# Patient Record
Sex: Female | Born: 1990
Health system: Southern US, Community
[De-identification: ages and names within clinical notes are randomized; demographics above are authoritative.]

## PROBLEM LIST (undated history)

## (undated) DIAGNOSIS — J302 Other seasonal allergic rhinitis: Secondary | ICD-10-CM

## (undated) DIAGNOSIS — F988 Other specified behavioral and emotional disorders with onset usually occurring in childhood and adolescence: Secondary | ICD-10-CM

## (undated) DIAGNOSIS — L709 Acne, unspecified: Secondary | ICD-10-CM

## (undated) HISTORY — DX: Other specified behavioral and emotional disorders with onset usually occurring in childhood and adolescence: F98.8

## (undated) HISTORY — PX: MOUTH SURGERY: SHX715

## (undated) HISTORY — DX: Acne, unspecified: L70.9

## (undated) HISTORY — DX: Other seasonal allergic rhinitis: J30.2

## (undated) HISTORY — PX: ADENOIDECTOMY: SHX5191

## (undated) HISTORY — PX: TYMPANOSTOMY TUBE PLACEMENT: SHX32

---
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
## 2010-06-28 ENCOUNTER — Ambulatory Visit: Payer: Self-pay | Admitting: Obstetrics and Gynecology

## 2011-07-19 ENCOUNTER — Other Ambulatory Visit: Payer: Self-pay | Admitting: *Deleted

## 2011-07-19 MED ORDER — DESOGESTREL-ETHINYL ESTRADIOL 0.15-30 MG-MCG PO TABS: 1.0000 | ORAL_TABLET | Freq: Every day | ORAL | Status: DC

## 2011-09-12 ENCOUNTER — Encounter: Payer: Self-pay | Admitting: Obstetrics and Gynecology

## 2011-09-16 ENCOUNTER — Ambulatory Visit (INDEPENDENT_AMBULATORY_CARE_PROVIDER_SITE_OTHER): Payer: BC Managed Care – PPO | Admitting: Physician Assistant

## 2011-09-16 VITALS — BP 105/69 | HR 114 | Temp 98.2°F | Resp 16 | Ht 60.5 in | Wt 108.0 lb

## 2011-09-16 DIAGNOSIS — J069 Acute upper respiratory infection, unspecified: Secondary | ICD-10-CM

## 2011-09-16 MED ORDER — AMOXICILLIN 875 MG PO TABS: 1750.0000 mg | ORAL_TABLET | Freq: Two times a day (BID) | ORAL | Status: AC

## 2011-09-16 MED ORDER — GUAIFENESIN ER 1200 MG PO TB12: 1.0000 | ORAL_TABLET | Freq: Two times a day (BID) | ORAL | Status: DC | PRN

## 2011-09-16 MED ORDER — IPRATROPIUM BROMIDE 0.03 % NA SOLN: 2.0000 | Freq: Two times a day (BID) | NASAL | Status: DC

## 2011-09-16 MED ORDER — AMOXICILLIN 875 MG PO TABS: 1750.0000 mg | ORAL_TABLET | Freq: Two times a day (BID) | ORAL | Status: DC

## 2011-09-16 NOTE — Patient Instructions (Signed)
Rest.  Drink at least 64 ounces of liquid daily.

## 2011-09-16 NOTE — Progress Notes (Signed)
  Subjective:    Patient ID: Ariel Jones, female    DOB: 11-02-1990, 21 y.o.   MRN: [id number]  HPI  Nasal congestion; pressure in cheeks and forehead.  Worst first thing in the mornings.  Pseudofed helps, but doesn't last long. Nausea this am, no vomiting.  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  Home now for Spring Break.  Review of Systems As above. No fever or chills. No cough. No sore throat. No ear pain. No headache. No diarrhea or constipation. No myalgias, arthralgias or rash.    Objective:   Physical Exam  Vitals reviewed. Constitutional: She is oriented to person, place, and time. Vital signs are normal. She appears well-developed and well-nourished. No distress.  HENT:  Head: Normocephalic and atraumatic.  Right Ear: Hearing, tympanic membrane, external ear and ear canal normal.  Left Ear: Hearing, tympanic membrane, external ear and ear canal normal.  Nose: Mucosal edema and rhinorrhea present.  No foreign bodies. Right sinus exhibits no maxillary sinus tenderness and no frontal sinus tenderness. Left sinus exhibits no maxillary sinus tenderness and no frontal sinus tenderness.  Mouth/Throat: Uvula is midline, oropharynx is clear and moist and mucous membranes are normal. No uvula swelling. No oropharyngeal exudate.  Eyes: Conjunctivae and EOM are normal. Pupils are equal, round, and reactive to light. Right eye exhibits no discharge. Left eye exhibits no discharge. No scleral icterus.  Neck: Trachea normal, normal range of motion and full passive range of motion without pain. Neck supple. No mass and no thyromegaly present.  Cardiovascular: Normal rate, regular rhythm and normal heart sounds.   Pulmonary/Chest: Effort normal and breath sounds normal.  Lymphadenopathy:       Head (right side): No submandibular, no tonsillar, no preauricular, no posterior auricular and no occipital adenopathy present.       Head (left side): No submandibular, no tonsillar, no  preauricular and no occipital adenopathy present.    She has no cervical adenopathy.       Right: No supraclavicular adenopathy present.       Left: No supraclavicular adenopathy present.  Neurological: She is alert and oriented to person, place, and time. She has normal strength. No cranial nerve deficit or sensory deficit.  Skin: Skin is warm, dry and intact. No rash noted.  Psychiatric: She has a normal mood and affect. Her speech is normal and behavior is normal.      Assessment & Plan:  Viral URI versus early sinusitis. Supportive measures.  Anticipatory guidance provided. OTC Mucinex maximum strength twice a day, Atrovent nasal spray twice a day. If no improvement in her symptoms over the next 3 days advise that she fill amoxicillin 875 mg, 2 by mouth twice a day x5 days, #20 no refills.

## 2011-09-18 ENCOUNTER — Ambulatory Visit (INDEPENDENT_AMBULATORY_CARE_PROVIDER_SITE_OTHER): Payer: BC Managed Care – PPO | Admitting: Obstetrics and Gynecology

## 2011-09-18 ENCOUNTER — Encounter: Payer: Self-pay | Admitting: Obstetrics and Gynecology

## 2011-09-18 VITALS — BP 110/60 | Ht 60.0 in | Wt 106.0 lb

## 2011-09-18 DIAGNOSIS — L709 Acne, unspecified: Secondary | ICD-10-CM | POA: Insufficient documentation

## 2011-09-18 DIAGNOSIS — F988 Other specified behavioral and emotional disorders with onset usually occurring in childhood and adolescence: Secondary | ICD-10-CM | POA: Insufficient documentation

## 2011-09-18 DIAGNOSIS — J302 Other seasonal allergic rhinitis: Secondary | ICD-10-CM | POA: Insufficient documentation

## 2011-09-18 DIAGNOSIS — Z01419 Encounter for gynecological examination (general) (routine) without abnormal findings: Secondary | ICD-10-CM

## 2011-09-18 MED ORDER — DESOGESTREL-ETHINYL ESTRADIOL 0.15-30 MG-MCG PO TABS: 1.0000 | ORAL_TABLET | Freq: Every day | ORAL | Status: DC

## 2011-09-18 NOTE — Progress Notes (Signed)
Patient came to see me today for her annual GYN exam. She remains on birth control pills for dysmenorrhea, regularity, and acne. She is very happy on them. She has never been sexually active. She does lab through PCP. She is having no pelvic pain. 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  Physical examination: Ariel Jones present HEENT within normal limits. Neck: Thyroid not large. No masses. Supraclavicular nodes: not enlarged. Breasts: Examined in both sitting midline position. No skin changes and no masses. Abdomen: Soft no guarding rebound or masses or hernia. Pelvic: External: Within normal limits. BUS: Within normal limits. Vaginal:within normal limits. Good estrogen effect. No evidence of cystocele rectocele or enterocele. Cervix: clean. Uterus: Normal size and shape. Adnexa: No masses. Rectovaginal exam: Confirmatory and negative. Extremities: Within normal limits.  Assessment: Normal GYN exam  Plan: Continue oral contraceptives

## 2011-09-19 LAB — URINALYSIS W MICROSCOPIC + REFLEX CULTURE
Casts: NONE SEEN
Glucose, UA: NEGATIVE mg/dL
Hgb urine dipstick: NEGATIVE
Nitrite: NEGATIVE
Specific Gravity, Urine: 1.009 (ref 1.005–1.030)
pH: 7.5 (ref 5.0–8.0)

## 2011-09-20 LAB — URINE CULTURE: Colony Count: NO GROWTH

## 2011-12-06 ENCOUNTER — Telehealth: Payer: Self-pay

## 2011-12-06 NOTE — Telephone Encounter (Signed)
Pt is requesting vyvance  90 day supply written 50 mg

## 2011-12-07 MED ORDER — LISDEXAMFETAMINE DIMESYLATE 50 MG PO CAPS: 50.0000 mg | ORAL_CAPSULE | ORAL | Status: DC

## 2011-12-07 NOTE — Telephone Encounter (Signed)
LMOM RX ready pick up. 

## 2011-12-07 NOTE — Telephone Encounter (Signed)
90 day Rx of vyvanse ready to p/up or mail to patient

## 2012-01-21 ENCOUNTER — Telehealth: Payer: Self-pay | Admitting: Family Medicine

## 2012-01-21 NOTE — Telephone Encounter (Signed)
Patient came in to drop off copy of Vyvanse rx from May 25th-- she tried to mail it into [facility], but they will not take a 90 day rx from a PA--must be from an MD.  Dr. Merla Riches, it appears you saw this patient last August for this.  Can we rx another 90 day for patient to pick up?  Chart in your box.  Please contact patient at [phone].

## 2012-01-22 NOTE — Telephone Encounter (Signed)
The chart indicates that she takes both a 50 mg and 30 mg tablet together in the morning to make up 80 mg of vyvanse//the prescription attached to her chart is just for the 50 mg size Does she need a prescription for both 50 and 30?

## 2012-01-23 NOTE — Telephone Encounter (Signed)
LMOM on cell to CB. H # vm was full.

## 2012-01-23 NOTE — Telephone Encounter (Signed)
Pt only needs 50mg  because she doesn't take as much of the 30's.

## 2012-01-24 MED ORDER — LISDEXAMFETAMINE DIMESYLATE 50 MG PO CAPS: 50.0000 mg | ORAL_CAPSULE | ORAL | Status: DC

## 2012-01-24 NOTE — Telephone Encounter (Signed)
done

## 2012-01-25 NOTE — Telephone Encounter (Signed)
Called pt, LMOM Rx ready to pick up

## 2012-04-14 ENCOUNTER — Telehealth: Payer: Self-pay

## 2012-04-14 NOTE — Telephone Encounter (Signed)
I have called her back to advise. She plans to come in soon.

## 2012-04-14 NOTE — Telephone Encounter (Signed)
It has been greater than 1 year since she was seen, she needs follow up visit. I have advised patient we will not be able to provide her a 90 day Rx as we have in the past, but we may be able to get her a supply to last until she can come in, she states she is in school and can come in the next 2-3 weeks.

## 2012-04-14 NOTE — Telephone Encounter (Signed)
Not seen in 12 months.   She'll need to be seen before we can refill this medication.

## 2012-04-14 NOTE — Telephone Encounter (Signed)
Pt is needing a rx refill on her vivance 90day 50mg  vivance 90day 30mg  please call pt when ready for pick up [phone]

## 2012-04-27 ENCOUNTER — Ambulatory Visit (INDEPENDENT_AMBULATORY_CARE_PROVIDER_SITE_OTHER): Payer: BC Managed Care – PPO | Admitting: Family Medicine

## 2012-04-27 VITALS — BP 132/84 | HR 110 | Resp 18 | Ht 60.0 in | Wt 107.0 lb

## 2012-04-27 DIAGNOSIS — Z0000 Encounter for general adult medical examination without abnormal findings: Secondary | ICD-10-CM

## 2012-04-27 DIAGNOSIS — F988 Other specified behavioral and emotional disorders with onset usually occurring in childhood and adolescence: Secondary | ICD-10-CM

## 2012-04-27 MED ORDER — INFLUENZA VIRUS VACC SPLIT PF IM SUSP: 0.5000 mL | INTRAMUSCULAR | Status: DC

## 2012-04-27 NOTE — Progress Notes (Signed)
21 yo with 15 years of ADD treatment, 4 years of Vyvanse.  She is a Public relations account executive, Psychologist, occupational major, at [employer].  She is on fall break.  ROS: No significant palpitations but occasionally feels that heart is racing No significant weight loss. No insomnia  Patient's most recent evaluation was 4 years ago with a Dr. Jennette Banker.  Objective:  NAD Spent 25 minutes updating hx and discussing classwork Heart:  Reg, no murmur or gallop  Assessment:  Stable on meds  Plan:  Refill Vyvanse rx x 4 months.

## 2012-08-25 ENCOUNTER — Telehealth: Payer: Self-pay

## 2012-08-25 NOTE — Telephone Encounter (Signed)
PT STATES SHE NEED 2 PRESCRIPTIONS WRITTEN. ONE IS FOR VYVANSE 30MG S WITH A 90 DAY SUPPLY AND THE OTHER IS VYVANSE 15MG S WITH A 90 DAY SUPPLY AND IT HAVE TO BE WRITTEN ON A SEPARATE SHEET. ALSO STATED THAT ANY DR CAN WRITE IT. PLEASE CALL [phone]

## 2012-08-25 NOTE — Telephone Encounter (Signed)
Please advise. There is note patient was given Vyvanse, but no record of dosage, patient asking for 90 day supply, can we provide 30 day supply until Dr Milus Glazier in office

## 2012-08-25 NOTE — Telephone Encounter (Signed)
We cannot send in a 90 day supply. DEA database indicates patient got Vyvanse 50 mg #90, 90 days supply and Vyvanse 30 mg #90, 90 days supply both on 05/13/12. However, I do not see either of these prescriptions in epic. I do not feel comfortable writing for this medication. Please send to Dr. Milus Glazier to review.

## 2012-08-26 MED ORDER — LISDEXAMFETAMINE DIMESYLATE 30 MG PO CAPS: ORAL_CAPSULE | ORAL | Status: DC

## 2012-08-26 MED ORDER — LISDEXAMFETAMINE DIMESYLATE 50 MG PO CAPS: 50.0000 mg | ORAL_CAPSULE | ORAL | Status: DC

## 2012-08-26 NOTE — Telephone Encounter (Signed)
Dr Merla Riches has provided her one month of medications until she can return to clinic to see him. She has been worked in for an appt with Dr Merla Riches next month. I have called her mother to advise rx ready for pick up.

## 2012-08-26 NOTE — Telephone Encounter (Signed)
I have spoken to patient and she indicates she has been on this for years. I have spoken to Dr Merla Riches. He will try to help. I need to clarify her meds. I need her to call me back. I left message for her to call me .

## 2012-08-26 NOTE — Telephone Encounter (Signed)
Thank you. I have advised patient. Patient is in college, she is tearful. I have apologized to her for the inconvenience related to this. To you FYI

## 2012-09-21 ENCOUNTER — Encounter: Payer: Self-pay | Admitting: Gynecology

## 2012-09-21 ENCOUNTER — Other Ambulatory Visit (HOSPITAL_COMMUNITY)
Admission: RE | Admit: 2012-09-21 | Discharge: 2012-09-21 | Disposition: A | Discharge status: Home / Self Care | Payer: BC Managed Care – PPO | Source: Ambulatory Visit | Attending: Gynecology | Admitting: Gynecology

## 2012-09-21 ENCOUNTER — Ambulatory Visit (INDEPENDENT_AMBULATORY_CARE_PROVIDER_SITE_OTHER): Payer: BC Managed Care – PPO | Admitting: Gynecology

## 2012-09-21 VITALS — BP 116/70 | Ht 61.0 in | Wt 108.0 lb

## 2012-09-21 DIAGNOSIS — Z01419 Encounter for gynecological examination (general) (routine) without abnormal findings: Secondary | ICD-10-CM

## 2012-09-21 LAB — CBC WITH DIFFERENTIAL/PLATELET
Basophils Relative: 1 % (ref 0–1)
Eosinophils Absolute: 0 10*3/uL (ref 0.0–0.7)
Eosinophils Relative: 1 % (ref 0–5)
Lymphs Abs: 2.4 10*3/uL (ref 0.7–4.0)
MCH: 29.5 pg (ref 26.0–34.0)
MCHC: 34 g/dL (ref 30.0–36.0)
MCV: 86.8 fL (ref 78.0–100.0)
Neutrophils Relative %: 36 % — ABNORMAL LOW (ref 43–77)
Platelets: 255 10*3/uL (ref 150–400)
RBC: 4.1 MIL/uL (ref 3.87–5.11)

## 2012-09-21 LAB — GLUCOSE, RANDOM: Glucose, Bld: 85 mg/dL (ref 70–99)

## 2012-09-21 MED ORDER — DESOGESTREL-ETHINYL ESTRADIOL 0.15-30 MG-MCG PO TABS: 1.0000 | ORAL_TABLET | Freq: Every day | ORAL | Status: DC

## 2012-09-21 NOTE — Patient Instructions (Signed)
Continue on birth control pills as we discussed. Call me for any issues. Followup in one year for annual exam.

## 2012-09-21 NOTE — Addendum Note (Signed)
Addended by: Dayna Barker on: 09/21/2012 12:24 PM   Modules accepted: Orders

## 2012-09-21 NOTE — Progress Notes (Signed)
Ariel Jones 19-Nov-1990 [id number]        22 y.o.  G0P0 for annual exam.  Doing well without complaints.  Past medical history,surgical history, medications, allergies, family history and social history were all reviewed and documented in the EPIC chart. ROS:  Was performed and pertinent positives and negatives are included in the history.  Exam: Kim assistant Filed Vitals:   09/21/12 1151  BP: 116/70  Height: 5\' 1"  (1.549 m)  Weight: 108 lb (48.988 kg)   General appearance  Normal Skin grossly normal Head/Neck normal with no cervical or supraclavicular adenopathy thyroid normal Lungs  clear Cardiac RR, without RMG Abdominal  soft, nontender, without masses, organomegaly or hernia Breasts  examined lying and sitting without masses, retractions, discharge or axillary adenopathy. Pelvic  Ext/BUS/vagina  normal   Cervix  normal with Pap done  Uterus  anteverted, normal size, shape and contour, midline and mobile nontender   Adnexa  Without masses or tenderness    Anus and perineum  normal    without palpated masses or tenderness.    Assessment/Plan:  22 y.o. G0P0 female for annual exam.   1. BCPs. Patient's on  desogestrel containing BCPs for acne suppression. Virginal historically. Reviewed possible increased risk of thrombosis associated with desogestrel containing pills.  Patient understands and accepts and I refilled her times a year. Had some breakthrough bleeding this past month but otherwise regular menses. Will keep menstrual calendar and as long as results of them will follow. If recurrent breakthrough bleeding will call me to consider changing pills.  I prescribed pills at both her school pharmacy and home pharmacy. 2. STD screening. Patient virginal and declines. 3. Pap smear. First Pap smear done today. Plan 3 year repeat if normal. 4. Breast health. SBE monthly reviewed. 5. Gardasil series received. 6. Health maintenance. Baseline CBC urinalysis and glucose ordered.  Plan lipid profile further into the 20s. Followup one year, sooner as needed.    Dara Lords MD, 12:18 PM 09/21/2012

## 2012-09-22 LAB — URINALYSIS W MICROSCOPIC + REFLEX CULTURE
Casts: NONE SEEN
Glucose, UA: NEGATIVE mg/dL
Hgb urine dipstick: NEGATIVE
Ketones, ur: NEGATIVE mg/dL
Protein, ur: NEGATIVE mg/dL
pH: 6 (ref 5.0–8.0)

## 2012-09-23 ENCOUNTER — Ambulatory Visit (INDEPENDENT_AMBULATORY_CARE_PROVIDER_SITE_OTHER): Payer: BC Managed Care – PPO | Admitting: Internal Medicine

## 2012-09-23 VITALS — BP 104/70 | HR 93 | Temp 98.3°F | Resp 16 | Ht 61.0 in | Wt 108.0 lb

## 2012-09-23 DIAGNOSIS — F988 Other specified behavioral and emotional disorders with onset usually occurring in childhood and adolescence: Secondary | ICD-10-CM

## 2012-09-23 MED ORDER — LISDEXAMFETAMINE DIMESYLATE 50 MG PO CAPS: 50.0000 mg | ORAL_CAPSULE | ORAL | Status: DC

## 2012-09-23 MED ORDER — LISDEXAMFETAMINE DIMESYLATE 30 MG PO CAPS: ORAL_CAPSULE | ORAL | Status: DC

## 2012-09-23 NOTE — Progress Notes (Signed)
  Subjective:    Patient ID: Angelica Ran, female    DOB: 09/14/1990, 22 y.o.   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 lisdexamfetamine (VYVANSE) 30 MG capsule    Sig: One daily in the afternoon    Dispense:  90 capsule    Refill:  0    90 day  . lisdexamfetamine (VYVANSE) 50 MG capsule    Sig: Take 1 capsule (50 mg total) by mouth every morning.    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

## 2012-09-25 ENCOUNTER — Encounter: Payer: Self-pay | Admitting: Obstetrics and Gynecology

## 2012-10-06 ENCOUNTER — Other Ambulatory Visit: Payer: Self-pay

## 2012-10-06 MED ORDER — DESOGESTREL-ETHINYL ESTRADIOL 0.15-30 MG-MCG PO TABS: 1.0000 | ORAL_TABLET | Freq: Every day | ORAL | Status: DC

## 2012-11-30 ENCOUNTER — Telehealth: Payer: Self-pay

## 2012-11-30 NOTE — Telephone Encounter (Signed)
Pt is calling to verify when last tetanus was done in this office

## 2012-12-01 NOTE — Telephone Encounter (Signed)
July 02, 2010 was her last TDAP, called pt to let her know.

## 2012-12-15 ENCOUNTER — Encounter: Payer: Self-pay | Admitting: Internal Medicine

## 2013-01-11 ENCOUNTER — Telehealth: Payer: Self-pay

## 2013-01-11 DIAGNOSIS — F988 Other specified behavioral and emotional disorders with onset usually occurring in childhood and adolescence: Secondary | ICD-10-CM

## 2013-01-11 NOTE — Telephone Encounter (Signed)
Pt is calling to see if she could get 2 rx refills  1. Is Vyvanse 50 mg 2. Is Vyvanse 30 mg She is also wanting to get a code to get into her My Chart Call back number is [phone]

## 2013-01-12 MED ORDER — LISDEXAMFETAMINE DIMESYLATE 50 MG PO CAPS: 50.0000 mg | ORAL_CAPSULE | ORAL | Status: DC

## 2013-01-12 MED ORDER — LISDEXAMFETAMINE DIMESYLATE 30 MG PO CAPS: ORAL_CAPSULE | ORAL | Status: DC

## 2013-01-12 NOTE — Telephone Encounter (Signed)
Meds ordered this encounter  Medications  . lisdexamfetamine (VYVANSE) 50 MG capsule    Sig: Take 1 capsule (50 mg total) by mouth every morning.    Dispense:  90 capsule    Refill:  0  . lisdexamfetamine (VYVANSE) 30 MG capsule    Sig: One daily in the afternoon    Dispense:  90 capsule    Refill:  0    90 day

## 2013-01-12 NOTE — Telephone Encounter (Signed)
Patient advised Rx at desk along with the activation code

## 2013-01-12 NOTE — Telephone Encounter (Signed)
Pended please advise printed mychart code for her. Will place in envelope with the Rx's Amy

## 2013-03-24 ENCOUNTER — Ambulatory Visit (INDEPENDENT_AMBULATORY_CARE_PROVIDER_SITE_OTHER): Payer: BC Managed Care – PPO | Admitting: Internal Medicine

## 2013-03-24 VITALS — BP 101/63 | HR 86 | Temp 98.1°F | Resp 16 | Ht 61.5 in | Wt 106.0 lb

## 2013-03-24 DIAGNOSIS — F988 Other specified behavioral and emotional disorders with onset usually occurring in childhood and adolescence: Secondary | ICD-10-CM

## 2013-03-24 MED ORDER — LISDEXAMFETAMINE DIMESYLATE 50 MG PO CAPS: 50.0000 mg | ORAL_CAPSULE | ORAL | Status: DC

## 2013-03-24 MED ORDER — LISDEXAMFETAMINE DIMESYLATE 30 MG PO CAPS: ORAL_CAPSULE | ORAL | Status: DC

## 2013-03-24 NOTE — Progress Notes (Signed)
  Subjective:    Patient ID: Ariel Jones, female    DOB: 01-21-1991, 22 y.o.   MRN: [id number]  HPIf/u ADD Dropped out of furman --unsure what to do  Currently working at [employer] (with sister Irving Burton), working at front [employer], looking into doing scribe work. Taking a break from school. Needs to take a few classes. Living at home, "not too difficult." Still needs Vyvanse to work. "It is hard," if she doesn't take it. Can't describe. Doesn't function well without it.  Health good.  Review of Systems neg    Objective:   Physical Exam  Nursing note and vitals reviewed. Constitutional: She is oriented to person, place, and time. She appears well-developed and well-nourished. No distress.  HENT:  Head: Normocephalic and atraumatic.  Eyes: Pupils are equal, round, and reactive to light.  Neck: Normal range of motion.  Pulmonary/Chest: Effort normal. No respiratory distress.  Musculoskeletal: Normal range of motion.  Neurological: She is alert and oriented to person, place, and time.  Skin: Skin is warm and dry.  Psychiatric: She has a normal mood and affect. Her behavior is normal.   BP 101/63  Pulse 86  Temp(Src) 98.1 F (36.7 C)  Resp 16  Ht 5' 1.5" (1.562 m)  Wt 106 lb (48.081 kg)  BMI 19.71 kg/m2  LMP 03/10/2013        Assessment & Plan:  ADD  Meds ordered this encounter  Medications  . lisdexamfetamine (VYVANSE) 30 MG capsule    Sig: One daily in the afternoon    Dispense:  90 capsule    Refill:  0    90 day  . lisdexamfetamine (VYVANSE) 50 MG capsule    Sig: Take 1 capsule (50 mg total) by mouth every morning.    Dispense:  90 capsule    Refill:  0  . DISCONTD: lisdexamfetamine (VYVANSE) 30 MG capsule    Sig: For 10/10/14One daily in the afternoon    Dispense:  30 capsule    Refill:  0  . DISCONTD: lisdexamfetamine (VYVANSE) 30 MG capsule    Sig: One daily in the afternoon. For 05/24/13    Dispense:  30 capsule    Refill:  0  .  DISCONTD: lisdexamfetamine (VYVANSE) 50 MG capsule    Sig: Take 1 capsule (50 mg total) by mouth every morning. For 04/23/13    Dispense:  90 capsule    Refill:  0  . DISCONTD: lisdexamfetamine (VYVANSE) 50 MG capsule    Sig: Take 1 capsule (50 mg total) by mouth every morning. For 05/24/13    Dispense:  30 capsule    Refill:  0   Remember 90d supply mail order in future F/u 6 mo

## 2013-04-22 ENCOUNTER — Ambulatory Visit (INDEPENDENT_AMBULATORY_CARE_PROVIDER_SITE_OTHER): Payer: BC Managed Care – PPO | Admitting: *Deleted

## 2013-04-22 DIAGNOSIS — Z23 Encounter for immunization: Secondary | ICD-10-CM

## 2013-05-14 ENCOUNTER — Other Ambulatory Visit: Payer: Self-pay | Admitting: Dermatology

## 2013-05-24 ENCOUNTER — Encounter: Payer: Self-pay | Admitting: Internal Medicine

## 2013-05-26 ENCOUNTER — Other Ambulatory Visit: Payer: Self-pay | Admitting: Dermatology

## 2013-07-25 ENCOUNTER — Telehealth: Payer: Self-pay

## 2013-07-25 DIAGNOSIS — F988 Other specified behavioral and emotional disorders with onset usually occurring in childhood and adolescence: Secondary | ICD-10-CM

## 2013-07-25 NOTE — Telephone Encounter (Signed)
Pt called to request a written prescription Vyvanse 50 mg, also another for 30 mg, 90 day prescription

## 2013-07-27 MED ORDER — LISDEXAMFETAMINE DIMESYLATE 30 MG PO CAPS: ORAL_CAPSULE | ORAL | Status: DC

## 2013-07-27 MED ORDER — LISDEXAMFETAMINE DIMESYLATE 50 MG PO CAPS
50.0000 mg | ORAL_CAPSULE | ORAL | Status: DC
Start: 2013-07-27 — End: 2013-09-22

## 2013-07-27 NOTE — Telephone Encounter (Signed)
I am only able to write for a 30d supply.  Pt will need an ov next month with Dr Merla Richesoolittle who might write her 90d supply.

## 2013-07-27 NOTE — Telephone Encounter (Signed)
Notified pt ready and pt agreed to f/up w/Dr Merla Richesoolittle.

## 2013-07-31 ENCOUNTER — Ambulatory Visit (INDEPENDENT_AMBULATORY_CARE_PROVIDER_SITE_OTHER): Payer: BC Managed Care – PPO | Admitting: Physician Assistant

## 2013-07-31 VITALS — BP 102/76 | HR 100 | Temp 98.2°F | Resp 16 | Ht 61.0 in | Wt 104.4 lb

## 2013-07-31 DIAGNOSIS — Z111 Encounter for screening for respiratory tuberculosis: Secondary | ICD-10-CM

## 2013-07-31 DIAGNOSIS — Z7185 Encounter for immunization safety counseling: Secondary | ICD-10-CM

## 2013-07-31 DIAGNOSIS — Z7189 Other specified counseling: Secondary | ICD-10-CM

## 2013-07-31 NOTE — Progress Notes (Signed)
   Subjective:    Patient ID: Ariel RanBarbara L Jones, female    DOB: 1991/03/08, 23 y.o.   MRN: [id number]  HPI 23 year old female presents for immunization review and TB skin test. 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  Is up to date on all immunizations.   She is healthy and has no other concerns today.    See TB questionnaire below.    Tuberculosis Risk Questionnaire  1. No Were you born outside the [place] in one of the following parts of the world: [place], [place], [place], [place] or [place]?    2. No Have you traveled outside the [place] and lived for more than one month in one of the following parts of the world: [place], [place], [place], [place] or [place]?    3. No Do you have a compromised immune system such as from any of the following conditions:HIV/AIDS, organ or bone marrow transplantation, diabetes, immunosuppressive medicines (e.g. Prednisone, Remicaide), leukemia, lymphoma, cancer of the head or neck, gastrectomy or jejunal bypass, end-stage renal disease (on dialysis), or silicosis?     4. No Have you ever or do you plan on working in: a residential care center, a health care facility, a jail or prison or homeless shelter?    5. Yes - plans to go to medical school. Have you ever: injected illegal drugs, used crack cocaine, lived in a homeless shelter  or been in jail or prison?     6. No Have you ever been exposed to anyone with infectious tuberculosis?    Tuberculosis Symptom Questionnaire  Do you currently have any of the following symptoms?  1. No Unexplained cough lasting more than 3 weeks?   2. No Unexplained fever lasting more than 3 weeks.   3. No Night Sweats (sweating that leaves the bedclothes and sheets wet)     4. No Shortness of Breath   5. No Chest Pain   6. No Unintentional weight loss    7. No Unexplained fatigue (very tired for no reason)     Review of Systems  Constitutional:  Negative for fever, chills and unexpected weight change.  Respiratory: Negative for cough.   Gastrointestinal: Negative for nausea and vomiting.  Neurological: Negative for headaches.       Objective:   Physical Exam  Constitutional: She is oriented to person, place, and time. She appears well-developed and well-nourished.  HENT:  Head: Normocephalic and atraumatic.  Right Ear: External ear normal.  Left Ear: External ear normal.  Eyes: Conjunctivae are normal.  Neck: Normal range of motion.  Cardiovascular: Normal rate.   Pulmonary/Chest: Effort normal.  Neurological: She is alert and oriented to person, place, and time.  Psychiatric: She has a normal mood and affect. Her behavior is normal. Judgment and thought content normal.          Assessment & Plan:   Screening for tuberculosis - Plan: TB Skin Test  Immunization counseling  Will go ahead and place a TB test due to health related field. Patient will return in 1 week for second step Up to date on all immunizations.  Form completed and signed - copy to be scanned.

## 2013-08-02 ENCOUNTER — Ambulatory Visit (INDEPENDENT_AMBULATORY_CARE_PROVIDER_SITE_OTHER): Payer: BC Managed Care – PPO | Admitting: *Deleted

## 2013-08-02 DIAGNOSIS — Z111 Encounter for screening for respiratory tuberculosis: Secondary | ICD-10-CM

## 2013-08-02 LAB — TB SKIN TEST
Induration: 0 mm
TB Skin Test: NEGATIVE

## 2013-08-12 ENCOUNTER — Ambulatory Visit (INDEPENDENT_AMBULATORY_CARE_PROVIDER_SITE_OTHER): Payer: BC Managed Care – PPO | Admitting: Physician Assistant

## 2013-08-12 VITALS — BP 108/70 | HR 84 | Temp 98.2°F | Resp 16 | Ht 60.5 in | Wt 106.4 lb

## 2013-08-12 DIAGNOSIS — Z111 Encounter for screening for respiratory tuberculosis: Secondary | ICD-10-CM

## 2013-08-12 NOTE — Progress Notes (Signed)
Here for second of 2 step TB.

## 2013-08-15 ENCOUNTER — Ambulatory Visit (INDEPENDENT_AMBULATORY_CARE_PROVIDER_SITE_OTHER): Payer: BC Managed Care – PPO | Admitting: Family Medicine

## 2013-08-15 ENCOUNTER — Encounter: Payer: Self-pay | Admitting: Family Medicine

## 2013-08-15 DIAGNOSIS — Z111 Encounter for screening for respiratory tuberculosis: Secondary | ICD-10-CM

## 2013-08-15 LAB — TB SKIN TEST: TB Skin Test: NEGATIVE

## 2013-08-15 NOTE — Progress Notes (Signed)
   Subjective:    Patient ID: Angelica RanBarbara L Hoiland, female    DOB: 09/18/1990, 23 y.o.   MRN: [id number]  HPI This 23 y.o. female presents for evaluation for Tb read.     Review of Systems     Objective:   Physical Exam   Per Rodi, Tb reading 0mm.     Assessment & Plan:  Visit for TB skin test  1.  TB screen:  PPD negative/0.

## 2013-09-22 ENCOUNTER — Ambulatory Visit (INDEPENDENT_AMBULATORY_CARE_PROVIDER_SITE_OTHER): Payer: BC Managed Care – PPO | Admitting: Internal Medicine

## 2013-09-22 ENCOUNTER — Encounter: Payer: Self-pay | Admitting: Internal Medicine

## 2013-09-22 ENCOUNTER — Ambulatory Visit: Payer: BC Managed Care – PPO | Admitting: Internal Medicine

## 2013-09-22 VITALS — BP 110/74 | HR 116 | Temp 99.7°F | Resp 16 | Ht 60.5 in | Wt 107.6 lb

## 2013-09-22 DIAGNOSIS — F988 Other specified behavioral and emotional disorders with onset usually occurring in childhood and adolescence: Secondary | ICD-10-CM

## 2013-09-22 MED ORDER — LISDEXAMFETAMINE DIMESYLATE 30 MG PO CAPS: ORAL_CAPSULE | ORAL | Status: DC

## 2013-09-22 MED ORDER — LISDEXAMFETAMINE DIMESYLATE 50 MG PO CAPS
50.0000 mg | ORAL_CAPSULE | ORAL | Status: DC
Start: 2013-09-22 — End: 2014-01-18

## 2013-09-22 MED ORDER — MOMETASONE FUROATE 50 MCG/ACT NA SUSP: 2.0000 | Freq: Every day | NASAL | Status: DC

## 2013-09-22 NOTE — Progress Notes (Addendum)
  This chart was scribed for Tonye Pearsonobert P Etsuko Dierolf, MD by Joaquin MusicKristina Sanchez-Matthews, ED Scribe. This patient was seen in room Room/bed 27 and the patient's care was started at 3:59 PM. Subjective:    Patient ID: Ariel RanBarbara L Jones, female    DOB: 1991/03/15, 23 y.o.   MRN: [id number] Chief Complaint  Patient presents with  . Medication Refill    Vyvanse 30 mg, 50 mg, Nasonex   HPI HPI Comments: Ariel RanBarbara L Ariel Jones is a 23 y.o. female who presents to the [employer] for F/U apt. 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 She states her classes will last 1 semester and will be taking the state exam soon. Pt states she has been looking for employment at [employer]  for weekend shifts. She states she is still working at [employer]. 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 Pt states her ultimate goal is still medical school, will retake classes at [employer] and plans to gain experience.   Pt is requesting refill for Nasonex and Vyvanse. She requested to have a 90-supply for Vyvanse due to having insurance coverage.  Review of Systems     Objective:   Physical Exam  Nursing note and vitals reviewed. Constitutional: She is oriented to person, place, and time. She appears well-developed and well-nourished. No distress.  HENT:  Head: Normocephalic.  Eyes: EOM are normal. Pupils are equal, round, and reactive to light.  Neck: Neck supple.  Cardiovascular: Normal rate.   Pulmonary/Chest: Effort normal.  Neurological: She is alert and oriented to person, place, and time.  Psychiatric: She has a normal mood and affect. Her behavior is normal. Thought content normal.       Assessment & Plan:  ADD (attention deficit disorder) - Plan: lisdexamfetamine (VYVANSE) 50 MG capsule, lisdexamfetamine (VYVANSE) 30 MG capsule [place] will do a3 month supply w/ her insur Meds ordered this encounter  Medications  . Multiple Vitamin (MULTIVITAMIN) tablet      Sig: Take 1 tablet by mouth daily.  Marland Kitchen. lisdexamfetamine (VYVANSE) 50 MG capsule    Sig: Take 1 capsule (50 mg total) by mouth every morning.    Dispense:  90 capsule    Refill:  0    Order Specific Question:  Supervising Provider    Answer:  Pinkey Mcjunkin P [[id number]]  . lisdexamfetamine (VYVANSE) 30 MG capsule    Sig: One daily in the afternoon    Dispense:  90 capsule    Refill:  0    Order Specific Question:  Supervising Provider    Answer:  Arlett Goold P [[id number]]  . mometasone (NASONEX) 50 MCG/ACT nasal spray    Sig: Place 2 sprays into the nose daily.    Dispense:  17 g    Refill:  11     I have completed the patient encounter in its entirety as documented by the scribe, with editing by me where necessary. Amiyah Shryock P. Merla Richesoolittle, M.D.

## 2013-10-06 ENCOUNTER — Ambulatory Visit: Payer: BC Managed Care – PPO | Admitting: Internal Medicine

## 2013-10-18 ENCOUNTER — Encounter: Payer: BC Managed Care – PPO | Admitting: Gynecology

## 2013-10-20 ENCOUNTER — Ambulatory Visit (INDEPENDENT_AMBULATORY_CARE_PROVIDER_SITE_OTHER): Payer: BC Managed Care – PPO | Admitting: Emergency Medicine

## 2013-10-20 VITALS — BP 110/60 | HR 112 | Temp 98.6°F | Resp 16 | Ht 61.5 in | Wt 108.0 lb

## 2013-10-20 DIAGNOSIS — R21 Rash and other nonspecific skin eruption: Secondary | ICD-10-CM

## 2013-10-20 LAB — POCT RAPID STREP A (OFFICE): RAPID STREP A SCREEN: NEGATIVE

## 2013-10-20 NOTE — Progress Notes (Signed)
Subjective:    Patient ID: Ariel Jones, female    DOB: January 10, 1991, 23 y.o.   MRN: [id number]  HPI   Ariel Jones is a very pleasant 23 yr old female here with concern for a rash that she discovered yesterday.  First noticed on her stomach but now involves arms, legs, back.  The rash is slightly itchy, but not intensely pruritic.  The rash is uncomfortable but not painful.  She has had a slight HA for 2 days but thinks this may be allergy related.  She has not been febrile.  She denies any other associated symptoms including cough, sore throat, abdominal pain.  She has never had anything like this before.  She cannot think of any new contacts - specifically no foods, products, animals, plants, clothing.  She was at the beach this past weekend, returned about 48 hours ago - no known new exposures there, was at her family's beach house where she visits frequently.  She took a claritin last night without relief of symptoms   Review of Systems  Constitutional: Negative for fever, chills, activity change, appetite change and fatigue.  HENT: Negative for congestion, rhinorrhea and sore throat.   Respiratory: Negative for cough, shortness of breath and wheezing.   Cardiovascular: Negative.   Gastrointestinal: Negative.   Musculoskeletal: Negative for arthralgias and myalgias.  Skin: Positive for rash.  Neurological: Positive for headaches.       Objective:   Physical Exam  Vitals reviewed. Constitutional: She is oriented to person, place, and time. She appears well-developed and well-nourished. No distress.  HENT:  Head: Normocephalic and atraumatic.  Right Ear: Tympanic membrane and ear canal normal.  Left Ear: Tympanic membrane and ear canal normal.  Mouth/Throat: Uvula is midline, oropharynx is clear and moist and mucous membranes are normal.  Neck: Neck supple.  Cardiovascular: Regular rhythm and normal heart sounds.  Tachycardia present.   Pulmonary/Chest: Effort normal. She has no  wheezes. She has no rales.  Abdominal: Soft. There is no tenderness.  Lymphadenopathy:    She has no cervical adenopathy.  Neurological: She is alert and oriented to person, place, and time.  Skin: Skin is warm and dry.  Diffuse pinpoint papular rash across abd, back, legs; rash across abdomen feels rough almost like a scarlatina rash, however the lesions on her legs are more raised and appear almost urticarial; the lesions blanch with pressure; there is no tenderness; there are no vesicles or pustules; she has no involvement of groin or axilla; no involvement of face, palms, or soles  Psychiatric: She has a normal mood and affect. Her behavior is normal.    Results for orders placed in visit on 10/20/13  POCT RAPID STREP A (OFFICE)      Result Value Ref Range   Rapid Strep A Screen Negative  Negative       Assessment & Plan:  Rash and nonspecific skin eruption - Plan: POCT rapid strep A   Ariel Jones is a very pleasant 23 yr old female her with development of papular rash across abd, back, legs.  Rash has been present since yesterday.  No new contacts that pt is aware of.  She feels well today.  There is no fever, pharyngitis, or adenopathy.  Rapid strep is negative.  Etiology is unclear, but suspect an allergic process.  Will treat as such for now with Claritin, Benadryl, and Zantac.  Can try Aveeno baths and gentle moisturizer to skin.  If any symptoms are worsening,  changing, or new symptoms develop pt will call or RTC.  She is supposed to return to the beach tomorrow, but encouraged her to delay her trip until Friday to ensure that she is improving.  Case discussed and pt examined with Dr. Richard Miu MHS, PA-C [facility] 4/8/201511:58 AM

## 2013-10-20 NOTE — Patient Instructions (Signed)
Take Claritin 10mg  once daily  Take Benadryl 25mg  every 6 hours (can take less frequently if too sedating)  Take Zantac 150mg  twice daily  If any symptoms are worsening, not improving, or if you develop new symptoms please let us know

## 2013-10-21 ENCOUNTER — Telehealth: Payer: Self-pay | Admitting: Physician Assistant

## 2013-10-21 NOTE — Telephone Encounter (Signed)
Spoke with pt this morning.  The rash is still present but she thinks it is starting to improve.  The rash is definitely not worsening and she has not developed any new symptoms.  She will be in touch if anything changes

## 2013-11-01 ENCOUNTER — Encounter: Payer: Self-pay | Admitting: Gynecology

## 2013-11-01 ENCOUNTER — Ambulatory Visit (INDEPENDENT_AMBULATORY_CARE_PROVIDER_SITE_OTHER): Payer: BC Managed Care – PPO | Admitting: Gynecology

## 2013-11-01 VITALS — BP 112/66 | Ht 61.0 in | Wt 107.0 lb

## 2013-11-01 DIAGNOSIS — Z01419 Encounter for gynecological examination (general) (routine) without abnormal findings: Secondary | ICD-10-CM

## 2013-11-01 LAB — CBC WITH DIFFERENTIAL/PLATELET
BASOS PCT: 1 % (ref 0–1)
Basophils Absolute: 0.1 10*3/uL (ref 0.0–0.1)
Eosinophils Absolute: 0.1 10*3/uL (ref 0.0–0.7)
Eosinophils Relative: 1 % (ref 0–5)
HEMATOCRIT: 38.8 % (ref 36.0–46.0)
Hemoglobin: 13.2 g/dL (ref 12.0–15.0)
LYMPHS ABS: 2.3 10*3/uL (ref 0.7–4.0)
LYMPHS PCT: 37 % (ref 12–46)
MCH: 30.3 pg (ref 26.0–34.0)
MCHC: 34 g/dL (ref 30.0–36.0)
MCV: 89.2 fL (ref 78.0–100.0)
MONO ABS: 0.4 10*3/uL (ref 0.1–1.0)
MONOS PCT: 7 % (ref 3–12)
NEUTROS PCT: 54 % (ref 43–77)
Neutro Abs: 3.4 10*3/uL (ref 1.7–7.7)
Platelets: 287 10*3/uL (ref 150–400)
RBC: 4.35 MIL/uL (ref 3.87–5.11)
RDW: 13.4 % (ref 11.5–15.5)
WBC: 6.3 10*3/uL (ref 4.0–10.5)

## 2013-11-01 MED ORDER — DESOGESTREL-ETHINYL ESTRADIOL 0.15-30 MG-MCG PO TABS: 1.0000 | ORAL_TABLET | Freq: Every day | ORAL | Status: DC

## 2013-11-01 NOTE — Progress Notes (Signed)
Angelica RanBarbara L Ozaki [birth year] [id number]        23 y.o.  G0P0 for annual exam.  Doing well.  Past medical history,surgical history, problem list, medications, allergies, family history and social history were all reviewed and documented as reviewed in the EPIC chart.  ROS:  12 system ROS performed with pertinent positives and negatives included in the history, assessment and plan.  Included Systems: General, HEENT, Neck, Cardiovascular, Pulmonary, Gastrointestinal, Genitourinary, Musculoskeletal, Dermatologic, Endocrine, Hematological, Neurologic, Psychiatric Additional significant findings : Allergies with sinus congestion   Exam: Kim assistant Filed Vitals:   11/01/13 1057  BP: 112/66  Height: 5\' 1"  (1.549 m)  Weight: 107 lb (48.535 kg)   General appearance:  Normal affect, orientation and appearance. Skin: Grossly normal HEENT: Normal without gross oral lesions, cervical or supraclavicular adenopathy. Prominent adenoids bilaterally. Thyroid normal.  Lungs:  Clear without wheezing, rales or rhonchi Cardiac: RR, without RMG Abdominal:  Soft, nontender, without masses, guarding, rebound, organomegaly or hernia Breasts:  Examined lying and sitting without masses, retractions, discharge or axillary adenopathy. Pelvic:  Ext/BUS/vagina normal  Cervix normal  Uterus axial, normal size, shape and contour, midline and mobile nontender   Adnexa  Without masses or tenderness    Anus and perineum  Normal     Assessment/Plan:  23 y.o. G0P0 female for annual exam with regular menses, oral contraceptives.    1. Oral contraceptives. Patient continues on Yasmin equivalent doing well for acne suppression. Remains virginal. Regular menses. Understands and accepts possible increased risk of thrombosis such as stroke heart attack DVT. Refill x1 year provided. 2. Pap smear 09/2012 normal. No Pap smear done today. Repeat three-year interval per current screening guidelines 3. Breast health. SBE monthly  reviewed. 4. Gardasil series received 5. STD screening not warranted as she is virginal 6. Health maintenance. Baseline CBC urinalysis ordered. Glucose last year normal with no changes in weight or symptoms. Does report having a cholesterol done elsewhere which was normal. Followup one year, sooner as needed.   Note: This document was prepared with digital dictation and possible smart phrase technology. Any transcriptional errors that result from this process are unintentional.   Dara Lordsimothy P Anndrea Mihelich MD, 11:18 AM 11/01/2013

## 2013-11-01 NOTE — Patient Instructions (Signed)
Followup in one year for annual exam, sooner if any issues.  You may obtain a copy of any labs that were done today by logging onto MyChart as outlined in the instructions provided with your AVS (after visit summary). The office will not call with normal lab results but certainly if there are any significant abnormalities then we will contact you.   Health Maintenance, Female A healthy lifestyle and preventative care can promote health and wellness.  Maintain regular health, dental, and eye exams.  Eat a healthy diet. Foods like vegetables, fruits, whole grains, low-fat dairy products, and lean protein foods contain the nutrients you need without too many calories. Decrease your intake of foods high in solid fats, added sugars, and salt. Get information about a proper diet from your caregiver, if necessary.  Regular physical exercise is one of the most important things you can do for your health. Most adults should get at least 150 minutes of moderate-intensity exercise (any activity that increases your heart rate and causes you to sweat) each week. In addition, most adults need muscle-strengthening exercises on 2 or more days a week.   Maintain a healthy weight. The body mass index (BMI) is a screening tool to identify possible weight problems. It provides an estimate of body fat based on height and weight. Your caregiver can help determine your BMI, and can help you achieve or maintain a healthy weight. For adults 20 years and older:  A BMI below 18.5 is considered underweight.  A BMI of 18.5 to 24.9 is normal.  A BMI of 25 to 29.9 is considered overweight.  A BMI of 30 and above is considered obese.  Maintain normal blood lipids and cholesterol by exercising and minimizing your intake of saturated fat. Eat a balanced diet with plenty of fruits and vegetables. Blood tests for lipids and cholesterol should begin at age 20 and be repeated every 5 years. If your lipid or cholesterol levels are  high, you are over 50, or you are a high risk for heart disease, you may need your cholesterol levels checked more frequently.Ongoing high lipid and cholesterol levels should be treated with medicines if diet and exercise are not effective.  If you smoke, find out from your caregiver how to quit. If you do not use tobacco, do not start.  Lung cancer screening is recommended for adults aged 55 80 years who are at high risk for developing lung cancer because of a history of smoking. Yearly low-dose computed tomography (CT) is recommended for people who have at least a 30-pack-year history of smoking and are a current smoker or have quit within the past 15 years. A pack year of smoking is smoking an average of 1 pack of cigarettes a day for 1 year (for example: 1 pack a day for 30 years or 2 packs a day for 15 years). Yearly screening should continue until the smoker has stopped smoking for at least 15 years. Yearly screening should also be stopped for people who develop a health problem that would prevent them from having lung cancer treatment.  If you are pregnant, do not drink alcohol. If you are breastfeeding, be very cautious about drinking alcohol. If you are not pregnant and choose to drink alcohol, do not exceed 1 drink per day. One drink is considered to be 12 ounces (355 mL) of beer, 5 ounces (148 mL) of wine, or 1.5 ounces (44 mL) of liquor.  Avoid use of street drugs. Do not share needles with   anyone. Ask for help if you need support or instructions about stopping the use of drugs.  High blood pressure causes heart disease and increases the risk of stroke. Blood pressure should be checked at least every 1 to 2 years. Ongoing high blood pressure should be treated with medicines, if weight loss and exercise are not effective.  If you are 55 to 23 years old, ask your caregiver if you should take aspirin to prevent strokes.  Diabetes screening involves taking a blood sample to check your fasting  blood sugar level. This should be done once every 3 years, after age 45, if you are within normal weight and without risk factors for diabetes. Testing should be considered at a younger age or be carried out more frequently if you are overweight and have at least 1 risk factor for diabetes.  Breast cancer screening is essential preventative care for women. You should practice "breast self-awareness." This means understanding the normal appearance and feel of your breasts and may include breast self-examination. Any changes detected, no matter how small, should be reported to a caregiver. Women in their 20s and 30s should have a clinical breast exam (CBE) by a caregiver as part of a regular health exam every 1 to 3 years. After age 40, women should have a CBE every year. Starting at age 40, women should consider having a mammogram (breast X-ray) every year. Women who have a family history of breast cancer should talk to their caregiver about genetic screening. Women at a high risk of breast cancer should talk to their caregiver about having an MRI and a mammogram every year.  Breast cancer gene (BRCA)-related cancer risk assessment is recommended for women who have family members with BRCA-related cancers. BRCA-related cancers include breast, ovarian, tubal, and peritoneal cancers. Having family members with these cancers may be associated with an increased risk for harmful changes (mutations) in the breast cancer genes BRCA1 and BRCA2. Results of the assessment will determine the need for genetic counseling and BRCA1 and BRCA2 testing.  The Pap test is a screening test for cervical cancer. Women should have a Pap test starting at age 21. Between ages 21 and 29, Pap tests should be repeated every 2 years. Beginning at age 30, you should have a Pap test every 3 years as long as the past 3 Pap tests have been normal. If you had a hysterectomy for a problem that was not cancer or a condition that could lead to  cancer, then you no longer need Pap tests. If you are between ages 65 and 70, and you have had normal Pap tests going back 10 years, you no longer need Pap tests. If you have had past treatment for cervical cancer or a condition that could lead to cancer, you need Pap tests and screening for cancer for at least 20 years after your treatment. If Pap tests have been discontinued, risk factors (such as a new sexual partner) need to be reassessed to determine if screening should be resumed. Some women have medical problems that increase the chance of getting cervical cancer. In these cases, your caregiver may recommend more frequent screening and Pap tests.  The human papillomavirus (HPV) test is an additional test that may be used for cervical cancer screening. The HPV test looks for the virus that can cause the cell changes on the cervix. The cells collected during the Pap test can be tested for HPV. The HPV test could be used to screen women aged 30   years and older, and should be used in women of any age who have unclear Pap test results. After the age of 30, women should have HPV testing at the same frequency as a Pap test.  Colorectal cancer can be detected and often prevented. Most routine colorectal cancer screening begins at the age of 50 and continues through age 75. However, your caregiver may recommend screening at an earlier age if you have risk factors for colon cancer. On a yearly basis, your caregiver may provide home test kits to check for hidden blood in the stool. Use of a small camera at the end of a tube, to directly examine the colon (sigmoidoscopy or colonoscopy), can detect the earliest forms of colorectal cancer. Talk to your caregiver about this at age 50, when routine screening begins. Direct examination of the colon should be repeated every 5 to 10 years through age 75, unless early forms of pre-cancerous polyps or small growths are found.  Hepatitis C blood testing is recommended for  all people born from 1945 through 1965 and any individual with known risks for hepatitis C.  Practice safe sex. Use condoms and avoid high-risk sexual practices to reduce the spread of sexually transmitted infections (STIs). Sexually active women aged 25 and younger should be checked for Chlamydia, which is a common sexually transmitted infection. Older women with new or multiple partners should also be tested for Chlamydia. Testing for other STIs is recommended if you are sexually active and at increased risk.  Osteoporosis is a disease in which the bones lose minerals and strength with aging. This can result in serious bone fractures. The risk of osteoporosis can be identified using a bone density scan. Women ages 65 and over and women at risk for fractures or osteoporosis should discuss screening with their caregivers. Ask your caregiver whether you should be taking a calcium supplement or vitamin D to reduce the rate of osteoporosis.  Menopause can be associated with physical symptoms and risks. Hormone replacement therapy is available to decrease symptoms and risks. You should talk to your caregiver about whether hormone replacement therapy is right for you.  Use sunscreen. Apply sunscreen liberally and repeatedly throughout the day. You should seek shade when your shadow is shorter than you. Protect yourself by wearing long sleeves, pants, a wide-brimmed hat, and sunglasses year round, whenever you are outdoors.  Notify your caregiver of new moles or changes in moles, especially if there is a change in shape or color. Also notify your caregiver if a mole is larger than the size of a pencil eraser.  Stay current with your immunizations. Document Released: 01/14/2011 Document Revised: 10/26/2012 Document Reviewed: 01/14/2011 [employer] Patient Information 2014 [employer].   

## 2013-11-02 LAB — URINALYSIS W MICROSCOPIC + REFLEX CULTURE
BILIRUBIN URINE: NEGATIVE
Bacteria, UA: NONE SEEN
CRYSTALS: NONE SEEN
Casts: NONE SEEN
GLUCOSE, UA: NEGATIVE mg/dL
Hgb urine dipstick: NEGATIVE
Ketones, ur: NEGATIVE mg/dL
LEUKOCYTES UA: NEGATIVE
Nitrite: NEGATIVE
PROTEIN: NEGATIVE mg/dL
SPECIFIC GRAVITY, URINE: 1.021 (ref 1.005–1.030)
SQUAMOUS EPITHELIAL / LPF: NONE SEEN
Urobilinogen, UA: 0.2 mg/dL (ref 0.0–1.0)
pH: 7.5 (ref 5.0–8.0)

## 2013-11-04 ENCOUNTER — Ambulatory Visit (INDEPENDENT_AMBULATORY_CARE_PROVIDER_SITE_OTHER): Payer: BC Managed Care – PPO | Admitting: Physician Assistant

## 2013-11-04 VITALS — BP 110/70 | HR 103 | Temp 98.0°F | Resp 16 | Ht 60.5 in | Wt 109.0 lb

## 2013-11-04 DIAGNOSIS — J029 Acute pharyngitis, unspecified: Secondary | ICD-10-CM

## 2013-11-04 DIAGNOSIS — J309 Allergic rhinitis, unspecified: Secondary | ICD-10-CM

## 2013-11-04 DIAGNOSIS — J069 Acute upper respiratory infection, unspecified: Secondary | ICD-10-CM

## 2013-11-04 LAB — POCT RAPID STREP A (OFFICE): Rapid Strep A Screen: NEGATIVE

## 2013-11-04 MED ORDER — FLUTICASONE PROPIONATE 50 MCG/ACT NA SUSP: 2.0000 | Freq: Every day | NASAL | Status: DC

## 2013-11-04 MED ORDER — FIRST-DUKES MOUTHWASH MT SUSP: 10.0000 mL | OROMUCOSAL | Status: DC | PRN

## 2013-11-04 NOTE — Progress Notes (Signed)
   Subjective:    Patient ID: Ariel RanBarbara L Jones, female    DOB: 1991-06-26, 23 y.o.   MRN: [id number]  HPI   Ms. Ariel Jones is a very pleasant 23 yr old female here with concern for illness.  She reports that she has had a sore throat for about 4 days now - has been waxing and waning somewhat.  Right side > left.  She also has associated Right ear pain, sneezing, coughing.  No runny nose.  Does feel congestion in the right side of her face.  No fever.  Taking Mucinex.  No SOB or wheezing.  +allergies - takes daily claritin  Of note, I saw pt about 2 wks ago for a rash.  The rash has completely resolved with no sequelae.  Pt attributes rash to a new spray sunscreen she used at the beach.   Review of Systems  Constitutional: Negative for fever and chills.  HENT: Positive for congestion, ear pain, sinus pressure and sore throat. Negative for rhinorrhea, trouble swallowing and voice change.   Respiratory: Negative for cough, shortness of breath and wheezing.   Cardiovascular: Negative.   Gastrointestinal: Negative.   Musculoskeletal: Negative.   Skin: Negative.        Objective:   Physical Exam  Vitals reviewed. Constitutional: She is oriented to person, place, and time. She appears well-developed and well-nourished. No distress.  HENT:  Head: Normocephalic and atraumatic.  Right Ear: Ear canal normal. A middle ear effusion is present.  Left Ear: Ear canal normal. A middle ear effusion is present.  Mouth/Throat: Uvula is midline and mucous membranes are normal. Posterior oropharyngeal edema (mild) and posterior oropharyngeal erythema present. No oropharyngeal exudate or tonsillar abscesses.  Eyes: Conjunctivae are normal. No scleral icterus.  Neck: Neck supple.  Cardiovascular: Normal rate, regular rhythm and normal heart sounds.   Pulmonary/Chest: Effort normal and breath sounds normal. She has no wheezes. She has no rales.  Lymphadenopathy:    She has no cervical adenopathy.  Neurological:  She is alert and oriented to person, place, and time.  Skin: Skin is warm and dry.  Psychiatric: She has a normal mood and affect. Her behavior is normal.     Results for orders placed in visit on 11/04/13  POCT RAPID STREP A (OFFICE)      Result Value Ref Range   Rapid Strep A Screen Negative  Negative       Assessment & Plan:  Acute pharyngitis - Plan: POCT rapid strep A, Diphenhyd-Hydrocort-Nystatin (FIRST-DUKES MOUTHWASH) SUSP  Viral URI - Plan: fluticasone (FLONASE) 50 MCG/ACT nasal spray  Allergic rhinitis - Plan: fluticasone (FLONASE) 50 MCG/ACT nasal spray   Ms. Ariel Jones is a very pleasant 23 yr old female here with URI/allergy symptoms.  She is afebrile, lungs CTA, throat erythematous and slightly edematous but rapid strep negative.  Suspect viral etiology.  Will continue Claritin.  Add Flonase for allergies/mid ear effusions/sinus pressure.  Continue mucinex.  Push fluids, rest.  Ibuprofen or Tylenol if needed for pain relief.  Discussed RTC precautions  Pt to call or RTC if worsening or not improving  E. Ariel FurbishElizabeth Panda Jones MHS, PA-C [facility] 4/23/20153:32 PM

## 2013-11-04 NOTE — Patient Instructions (Signed)
I suspect that you have a cold virus, and then your underlying allergies may be contributing to your symptoms as well  Keep taking Claritin daily  Start the fluticasone (Flonase) - use 2 sprays each nostril twice daily for 1 week, then decrease to once daily dosing - this medicine works best with consistent daily use to control allergies.  It will also hopefully improve the fluid in your ears  Take Tylenol and/or Advil for throat pain.  You can also use the magic mouthwash every 2 hours if needed  Continue Mucinex to break up congestion  Plenty of fluids (water is best!) and rest  If any symptoms are worsening or not improving, please let us know   Upper Respiratory Infection, Adult An upper respiratory infection (URI) is also sometimes known as the common cold. The upper respiratory tract includes the nose, sinuses, throat, trachea, and bronchi. Bronchi are the airways leading to the lungs. Most people improve within 1 week, but symptoms can last up to 2 weeks. A residual cough may last even longer.  CAUSES Many different viruses can infect the tissues lining the upper respiratory tract. The tissues become irritated and inflamed and often become very moist. Mucus production is also common. A cold is contagious. You can easily spread the virus to others by oral contact. This includes kissing, sharing a glass, coughing, or sneezing. Touching your mouth or nose and then touching a surface, which is then touched by another person, can also spread the virus. SYMPTOMS  Symptoms typically develop 1 to 3 days after you come in contact with a cold virus. Symptoms vary from person to person. They may include:  Runny nose.  Sneezing.  Nasal congestion.  Sinus irritation.  Sore throat.  Loss of voice (laryngitis).  Cough.  Fatigue.  Muscle aches.  Loss of appetite.  Headache.  Low-grade fever. DIAGNOSIS  You might diagnose your own cold based on familiar symptoms, since most people  get a cold 2 to 3 times a year. Your caregiver can confirm this based on your exam. Most importantly, your caregiver can check that your symptoms are not due to another disease such as strep throat, sinusitis, pneumonia, asthma, or epiglottitis. Blood tests, throat tests, and X-rays are not necessary to diagnose a common cold, but they may sometimes be helpful in excluding other more serious diseases. Your caregiver will decide if any further tests are required. RISKS AND COMPLICATIONS  You may be at risk for a more severe case of the common cold if you smoke cigarettes, have chronic heart disease (such as heart failure) or lung disease (such as asthma), or if you have a weakened immune system. The very young and very old are also at risk for more serious infections. Bacterial sinusitis, middle ear infections, and bacterial pneumonia can complicate the common cold. The common cold can worsen asthma and chronic obstructive pulmonary disease (COPD). Sometimes, these complications can require emergency medical care and may be life-threatening. PREVENTION  The best way to protect against getting a cold is to practice good hygiene. Avoid oral or hand contact with people with cold symptoms. Wash your hands often if contact occurs. There is no clear evidence that vitamin C, vitamin E, echinacea, or exercise reduces the chance of developing a cold. However, it is always recommended to get plenty of rest and practice good nutrition. TREATMENT  Treatment is directed at relieving symptoms. There is no cure. Antibiotics are not effective, because the infection is caused by a virus, not  by bacteria. Treatment may include:  Increased fluid intake. Sports drinks offer valuable electrolytes, sugars, and fluids.  Breathing heated mist or steam (vaporizer or shower).  Eating chicken soup or other clear broths, and maintaining good nutrition.  Getting plenty of rest.  Using gargles or lozenges for  comfort.  Controlling fevers with ibuprofen or acetaminophen as directed by your caregiver.  Increasing usage of your inhaler if you have asthma. Zinc gel and zinc lozenges, taken in the first 24 hours of the common cold, can shorten the duration and lessen the severity of symptoms. Pain medicines may help with fever, muscle aches, and throat pain. A variety of non-prescription medicines are available to treat congestion and runny nose. Your caregiver can make recommendations and may suggest nasal or lung inhalers for other symptoms.  HOME CARE INSTRUCTIONS   Only take over-the-counter or prescription medicines for pain, discomfort, or fever as directed by your caregiver.  Use a warm mist humidifier or inhale steam from a shower to increase air moisture. This may keep secretions moist and make it easier to breathe.  Drink enough water and fluids to keep your urine clear or pale yellow.  Rest as needed.  Return to work when your temperature has returned to normal or as your caregiver advises. You may need to stay home longer to avoid infecting others. You can also use a face mask and careful hand washing to prevent spread of the virus. SEEK MEDICAL CARE IF:   After the first few days, you feel you are getting worse rather than better.  You need your caregiver's advice about medicines to control symptoms.  You develop chills, worsening shortness of breath, or brown or red sputum. These may be signs of pneumonia.  You develop yellow or brown nasal discharge or pain in the face, especially when you bend forward. These may be signs of sinusitis.  You develop a fever, swollen neck glands, pain with swallowing, or white areas in the back of your throat. These may be signs of strep throat. SEEK IMMEDIATE MEDICAL CARE IF:   You have a fever.  You develop severe or persistent headache, ear pain, sinus pain, or chest pain.  You develop wheezing, a prolonged cough, cough up blood, or have a  change in your usual mucus (if you have chronic lung disease).  You develop sore muscles or a stiff neck. Document Released: 12/25/2000 Document Revised: 09/23/2011 Document Reviewed: 11/02/2010 [facility] Patient Information 2014 [place], [place].

## 2014-01-17 ENCOUNTER — Telehealth: Payer: Self-pay

## 2014-01-17 DIAGNOSIS — F988 Other specified behavioral and emotional disorders with onset usually occurring in childhood and adolescence: Secondary | ICD-10-CM

## 2014-01-17 NOTE — Telephone Encounter (Signed)
Patient is calling for refill on lisdexamfetamine (VYVANSE) 30 MG capsule    States she called Sunday for Dr Merla Richesoolittle to refill   [facility] [place]   [phone]

## 2014-01-18 MED ORDER — LISDEXAMFETAMINE DIMESYLATE 30 MG PO CAPS: ORAL_CAPSULE | ORAL | Status: DC

## 2014-01-18 MED ORDER — LISDEXAMFETAMINE DIMESYLATE 50 MG PO CAPS: 50.0000 mg | ORAL_CAPSULE | ORAL | Status: DC

## 2014-01-18 NOTE — Telephone Encounter (Signed)
Meds ordered this encounter  Medications  . lisdexamfetamine (VYVANSE) 50 MG capsule    Sig: Take 1 capsule (50 mg total) by mouth every morning.    Dispense:  90 capsule    Refill:  0    Order Specific Question:  Supervising Provider    Answer:  Alesana Magistro P [[id number]]  . lisdexamfetamine (VYVANSE) 30 MG capsule    Sig: One daily in the afternoon    Dispense:  90 capsule    Refill:  0    Order Specific Question:  Supervising Provider    Answer:  Champayne Kocian P [[id number]]

## 2014-01-18 NOTE — Telephone Encounter (Signed)
Pt called in enquiring about this refill, I let her know it was being processed, and that she should receive a call 24-48 hours from the time she put the request in

## 2014-01-19 NOTE — Telephone Encounter (Signed)
Notified pt ready. 

## 2014-01-25 ENCOUNTER — Telehealth: Payer: Self-pay

## 2014-01-25 NOTE — Telephone Encounter (Signed)
PA pending for Pt Vyvanse sent on July 10th and resent today.

## 2014-01-25 NOTE — Telephone Encounter (Signed)
[facility], pharm called and said that they were also going to initiate the PA process in hopes that it can get done sooner for the pt. He said that he was going to send over a fax for both rx's/

## 2014-01-26 NOTE — Telephone Encounter (Signed)
Spoke to pt and [facility]. Pt goes by the name Ariel Jones (her middle name) her Rx is under [name]. Her [place]. I have submitted prior authorization again with the name corrected. Pt is aware of situation. Per her request I have placed an expedited review request on this prior auth today.

## 2014-01-27 NOTE — Telephone Encounter (Signed)
PATIENT IS CALLING TO FOLLOW UP AND SEE IF WE HAVE HEARD ANYTHING FROM HER INSURANCE.  PATIENT PHONE: [phone]

## 2014-01-28 NOTE — Telephone Encounter (Signed)
Prior Auth for Vyvanse 50mg  has been approved 01/26/14-01/27/2015.

## 2014-01-28 NOTE — Telephone Encounter (Signed)
Pt notified, faxed approval to pharmacy.  Per patient 50mg  and 30mg  has been approved.

## 2014-03-17 ENCOUNTER — Ambulatory Visit (INDEPENDENT_AMBULATORY_CARE_PROVIDER_SITE_OTHER): Payer: BC Managed Care – PPO | Admitting: Family Medicine

## 2014-03-17 VITALS — BP 110/72 | HR 93 | Temp 98.2°F | Resp 16 | Ht 61.0 in | Wt 108.8 lb

## 2014-03-17 DIAGNOSIS — J302 Other seasonal allergic rhinitis: Secondary | ICD-10-CM

## 2014-03-17 DIAGNOSIS — J3089 Other allergic rhinitis: Secondary | ICD-10-CM

## 2014-03-17 DIAGNOSIS — J069 Acute upper respiratory infection, unspecified: Secondary | ICD-10-CM

## 2014-03-17 NOTE — Patient Instructions (Signed)
Restart the flonase you have; use the saline rinse before the flonase. Okay to use Ibuprofen as needed.  Start phenylephrine as directed (behind the counter medication).   Upper Respiratory Infection, Adult An upper respiratory infection (URI) is also known as the common cold. It is often caused by a type of germ (virus). Colds are easily spread (contagious). You can pass it to others by kissing, coughing, sneezing, or drinking out of the same glass. Usually, you get better in 1 or 2 weeks.  HOME CARE   Only take medicine as told by your doctor.  Use a warm mist humidifier or breathe in steam from a hot shower.  Drink enough water and fluids to keep your pee (urine) clear or pale yellow.  Get plenty of rest.  Return to work when your temperature is back to normal or as told by your doctor. You may use a face mask and wash your hands to stop your cold from spreading. GET HELP RIGHT AWAY IF:   After the first few days, you feel you are getting worse.  You have questions about your medicine.  You have chills, shortness of breath, or brown or red spit (mucus).  You have yellow or brown snot (nasal discharge) or pain in the face, especially when you bend forward.  You have a fever, puffy (swollen) neck, pain when you swallow, or white spots in the back of your throat.  You have a bad headache, ear pain, sinus pain, or chest pain.  You have a high-pitched whistling sound when you breathe in and out (wheezing).  You have a lasting cough or cough up blood.  You have sore muscles or a stiff neck. MAKE SURE YOU:   Understand these instructions.  Will watch your condition.  Will get help right away if you are not doing well or get worse. Document Released: 12/18/2007 Document Revised: 09/23/2011 Document Reviewed: 10/06/2013 [facility] Patient Information 2015 [place], [place]. This information is not intended to replace advice given to you by your health care provider. Make sure you  discuss any questions you have with your health care provider.

## 2014-03-17 NOTE — Progress Notes (Signed)
  Ariel Jones - 23 y.o. female MRN [id number]  Date of birth: 1991-01-14  SUBJECTIVE:  Including CC & ROS.  The patient presents for evaluation of: Ear pain: 1 day history of worsening right-sided ear pain. She also reports general nasal congestion. Mildly scratchy throat. No cough. No fevers, chills. Otherwise feeling well. She does have a left posterior a lymph node that has been present in was initially painful but had decreased in size over the past month until the last day.  HISTORY: Past Medical, Surgical, Social, and Family History Reviewed & Updated per EMR. Pertinent Historical Findings include: ADD, Seasonal Allergies, ACNE  Non smoker, occasional EtOH  OBJECTIVE FINDINGS:  VS:  HT:5\' 1"  (154.9 cm)   WT:108 lb 12.8 oz (49.351 kg)  BMI:20.6          BP:110/72 mmHg  HR:93bpm  TEMP:98.2 F (36.8 C)(Oral)  RESP:99 %  PHYSICAL EXAM:            GENERAL:  Adult Caucasian female. In no discomfort; no respiratory distress                PSYCH:  alert and appropriate, good insight          HNEENT:  mmm, no JVD, bilateral tympanic membrane scarring with serous middle ear effusion. No erythema. Decreased mobility of the tympanic membrane.posterior oropharynx with minimal streaking erythema. No tonsillar exudate. Bilateral anterior cervical lymphadenopathy.        CARDIAC:  RRR, S1/S2 heard, no murmur           LUNGS:  CTA B, no wheezes, no crackles         EXTREM:  Warm, well perfused.  Moves all 4 extremities spontaneously; no lateralization.  ASSESSMENT: 1. Viral URI   2. Other seasonal allergic rhinitis    Likely underlying seasonal allergies with acute viral syndrome explaining lymphadenitis  PLAN: See problem based charting & AVS for additional documentation. - Restart seasonal allergy treatment with Flonase the patient is already prescribed. Continue Allegra daily. - Symptomatic treatment and I discussed the expected course and duration of a viral illness

## 2014-03-30 ENCOUNTER — Ambulatory Visit: Payer: BC Managed Care – PPO | Admitting: Internal Medicine

## 2014-04-06 ENCOUNTER — Encounter: Payer: Self-pay | Admitting: Internal Medicine

## 2014-04-06 ENCOUNTER — Ambulatory Visit (INDEPENDENT_AMBULATORY_CARE_PROVIDER_SITE_OTHER): Payer: BC Managed Care – PPO | Admitting: Internal Medicine

## 2014-04-06 VITALS — BP 108/68 | HR 106 | Temp 99.3°F | Resp 16 | Ht 61.0 in | Wt 107.2 lb

## 2014-04-06 DIAGNOSIS — F988 Other specified behavioral and emotional disorders with onset usually occurring in childhood and adolescence: Secondary | ICD-10-CM

## 2014-04-06 DIAGNOSIS — R000 Tachycardia, unspecified: Secondary | ICD-10-CM

## 2014-04-06 MED ORDER — LISDEXAMFETAMINE DIMESYLATE 50 MG PO CAPS: 50.0000 mg | ORAL_CAPSULE | ORAL | Status: DC

## 2014-04-06 NOTE — Progress Notes (Signed)
   Subjective:    Patient ID: Ariel Jones, female    DOB: 13-Apr-1991, 23 y.o.   MRN: [id number]  HPIf/u Patient Active Problem List   Diagnosis Date Noted  . ADD (attention deficit disorder)   . Seasonal allergies   . 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  Tachy w/ cardio noted--sustained for 30-40 min without chest pain or dizziness or change in vision or shortness in breath Cannot tell if this is related to stimulants since she has been on ADD medication for many years without breaks No history of cardiac problems No history of weight loss   Review of Systems No recent weight loss night sweats fevers chills fatigue or change in activity No chest pain No abdominal pain No neurological symptoms    Objective:   Physical Exam BP 108/68  Pulse 106  Temp(Src) 99.3 F (37.4 C) (Oral)  Resp 16  Ht  (1.549 m)  Wt 107 lb 3.2 oz (48.626 kg)  BMI 20.27 kg/m2  SpO2 100%  LMP 03/17/2014 PERRLA/Eoms conj Remainder of HEENT clear including no thyromegaly Chest clear to auscultation Heart regular without murmur or click--pulse 90 Extremities clear Neuro intact Mood good/affect appropriate/thought content normal/judgment sound       Assessment & Plan:  ADD (attention deficit disorder) - Plan: lisdexamfetamine (VYVANSE) 50 MG capsule  Tachycardia  Meds ordered this encounter  Medications  . lisdexamfetamine (VYVANSE) 50 MG capsule    Sig: Take 1 capsule (50 mg total) by mouth every morning.    Dispense:  90 capsule    Refill:  0    90 d supply   She will try days without Vyvanse to see the effect on her heart rate, but I think because the symptoms are associated so clearly with aerobic activity that she needs treadmill while on medication, or at least a Holter and I will refer her to cardiology for further evaluation

## 2014-05-13 ENCOUNTER — Encounter: Payer: Self-pay | Admitting: Cardiology

## 2014-05-13 ENCOUNTER — Ambulatory Visit (INDEPENDENT_AMBULATORY_CARE_PROVIDER_SITE_OTHER): Payer: BC Managed Care – PPO | Admitting: Cardiology

## 2014-05-13 VITALS — BP 110/70 | HR 96 | Ht 61.0 in | Wt 110.0 lb

## 2014-05-13 DIAGNOSIS — R072 Precordial pain: Secondary | ICD-10-CM

## 2014-05-13 DIAGNOSIS — R079 Chest pain, unspecified: Secondary | ICD-10-CM

## 2014-05-13 LAB — TSH: TSH: 1.956 u[IU]/mL (ref 0.350–4.500)

## 2014-05-13 NOTE — Progress Notes (Signed)
HPI The patient presents for presents for evaluation of chest pain. She has no past cardiac history. He is on medications for ADHD.  She says that when she takes her medications she can at some point during the day feel her heart rate speed up as the medicine seems to kick in.  She says that this bothers her when she exercises. She'll notice her heart rate to be higher than it should be. She gets chest discomfort. She describes it as a tightness. It is moderate in intensity. It days chest. There is no associated symptoms. Makes her stop what she is doing. She recover after a few minutes although she thinks she is more winded than she should be. She does not have these at rest. She does have a high resting heart rate. She hasn't had presyncope or syncope. She does have PND or orthopnea. He does drink a fair amount of caffeine. I  No Known Allergies  Current Outpatient Prescriptions  Medication Sig Dispense Refill  . desogestrel-ethinyl estradiol (APRI,EMOQUETTE,SOLIA) 0.15-30 MG-MCG tablet Take 1 tablet by mouth daily.  1 Package  11  . fluticasone (FLONASE) 50 MCG/ACT nasal spray Place 2 sprays into both nostrils daily.  16 g  12  . levocetirizine (XYZAL) 5 MG tablet Take 5 mg by mouth daily as needed.       Marland Kitchen. lisdexamfetamine (VYVANSE) 50 MG capsule Take 1 capsule (50 mg total) by mouth every morning.  90 capsule  0  . Loratadine (CLARITIN PO) Take by mouth daily as needed.       . Montelukast Sodium (SINGULAIR PO) Take by mouth daily as needed.       . Multiple Vitamin (MULTIVITAMIN) tablet Take 1 tablet by mouth daily.       No current facility-administered medications for this visit.    Past Medical History  Diagnosis Date  . ADD (attention deficit disorder)   . Seasonal allergies   . Acne     Past Surgical History  Procedure Laterality Date  . Tympanostomy tube placement    . Adenoidectomy    . Mouth surgery      Family History  Problem Relation Age of Onset  . Hypertension  Father   . Heart disease Father   . Hypertension Mother   . Cancer Maternal Grandmother     Melanoma  . Cancer Maternal Grandfather     Bladder cancer  . Heart disease Paternal Grandmother   . Cancer Paternal Grandmother     Melanoma  . Heart disease Paternal Grandfather     History   Social History  . Marital Status: Single    Spouse Name: N/A    Number of Children: N/A  . Years of Education: N/A   Occupational History  . Not on file.   Social History Main Topics  . Smoking status: Never Smoker   . Smokeless tobacco: Not on file  . Alcohol Use: 7.0 oz/week    14 drink(s) per week  . Drug Use: No  . Sexual Activity: No   Other Topics Concern  . Not on file   Social History Narrative  . No narrative on file    ROS:  As stated in the HPI and negative for all other systems.  PHYSICAL EXAM BP 110/70  Pulse 96  Ht 5\' 1"  (1.549 m)  Wt 110 lb (49.896 kg)  BMI 20.80 kg/m2 GENERAL:  Well appearing HEENT:  Pupils equal round and reactive, fundi not visualized, oral mucosa unremarkable NECK:  No jugular venous distention, waveform within normal limits, carotid upstroke brisk and symmetric, no bruits, no thyromegaly LYMPHATICS:  No cervical, inguinal adenopathy LUNGS:  Clear to auscultation bilaterally BACK:  No CVA tenderness CHEST:  Unremarkable HEART:  PMI not displaced or sustained,S1 and S2 within normal limits, no S3, no S4, no clicks, no rubs, no murmurs ABD:  Flat, positive bowel sounds normal in frequency in pitch, no bruits, no rebound, no guarding, no midline pulsatile mass, no hepatomegaly, no splenomegaly EXT:  2 plus pulses throughout, no edema, no cyanosis no clubbing SKIN:  No rashes no nodules NEURO:  Cranial nerves II through XII grossly intact, motor grossly intact throughout PSYCH:  Cognitively intact, oriented to person place and time   EKG:   Sinus rhythm, rate 96, axis within normal limits, intervals within normal limits, no acute ST-T wave  changes. 05/13/2014  ASSESSMENT AND PLAN  CHEST PAIN:  It is very unlikely that she has a structural heart disease. However, this could be a dysrhythmia. I will bring her back for a POET (Plain Old Exercise Treadmill) And we will try to make this a maximal test symptoms.  TACHYCARDIA:  I will order a TSH.  We discussed stopping caffeine.  Will

## 2014-05-13 NOTE — Patient Instructions (Signed)
Your physician recommends that you schedule a follow-up appointment in: one week after your treadmill test  We are ordering lab work

## 2014-05-18 NOTE — Progress Notes (Signed)
LMTCB (lab results) 

## 2014-05-19 ENCOUNTER — Telehealth: Payer: Self-pay | Admitting: Cardiology

## 2014-05-19 NOTE — Telephone Encounter (Signed)
Returning a call from [place] on yesterday.

## 2014-05-19 NOTE — Telephone Encounter (Signed)
Called pt; no answer. Was going to ask pt if she knew why Ariel Jones was calling.

## 2014-05-20 NOTE — Telephone Encounter (Signed)
Patient notified of lab results. Routed to PCP via EPIC

## 2014-05-31 ENCOUNTER — Telehealth (HOSPITAL_COMMUNITY): Payer: Self-pay

## 2014-05-31 NOTE — Telephone Encounter (Signed)
Encounter complete. 

## 2014-06-01 ENCOUNTER — Telehealth (HOSPITAL_COMMUNITY): Payer: Self-pay

## 2014-06-01 NOTE — Telephone Encounter (Signed)
Encounter complete. 

## 2014-06-02 ENCOUNTER — Ambulatory Visit (HOSPITAL_COMMUNITY)
Admission: RE | Admit: 2014-06-02 | Discharge: 2014-06-02 | Disposition: A | Discharge status: Home / Self Care | Payer: BC Managed Care – PPO | Source: Ambulatory Visit | Attending: Cardiology | Admitting: Cardiology

## 2014-06-02 DIAGNOSIS — R0789 Other chest pain: Secondary | ICD-10-CM | POA: Diagnosis not present

## 2014-06-02 DIAGNOSIS — R000 Tachycardia, unspecified: Secondary | ICD-10-CM | POA: Diagnosis not present

## 2014-06-02 DIAGNOSIS — R079 Chest pain, unspecified: Secondary | ICD-10-CM

## 2014-06-02 NOTE — Procedures (Signed)
Exercise Treadmill Test Test  Exercise Tolerance Test Ordering MD: Angelina SheriffJake Hochrein, MD  Interpreting MD:   Unique Test No: 1 Treadmill:  1  Indication for ETT: chest pain - rule out ischemia  Contraindication to ETT: No   Stress Modality: exercise - treadmill  Cardiac Imaging Performed: non   Protocol: standard Bruce - maximal  Max BP:  181/101  Max MPHR (bpm): 197 85% MPR (bpm): 167  MPHR obtained (bpm): 193 % MPHR obtained: 97  Reached 85% MPHR (min:sec):  4:20 Total Exercise Time (min-sec):  11:02  Workload in METS: 13.40 Borg Scale:   Reason ETT Terminated:  fatigue    ST Segment Analysis At Rest: Sinus tachycardia, RAE, nonspecific ST changes With Exercise: no evidence of significant ST depression  Other Information Arrhythmia:  No Angina during ETT:  present (1) Quality of ETT:  diagnostic  ETT Interpretation:  normal - no evidence of ischemia by ST analysis  Comments: ETT with good exercise tolerance (11:02); patient did complain of chest tightness; normal BP response; no ST changes; negative adequate ETT.  Olga MillersBrian Crenshaw

## 2014-06-27 ENCOUNTER — Encounter: Payer: Self-pay | Admitting: Cardiology

## 2014-06-27 ENCOUNTER — Ambulatory Visit (INDEPENDENT_AMBULATORY_CARE_PROVIDER_SITE_OTHER): Payer: BC Managed Care – PPO | Admitting: Cardiology

## 2014-06-27 VITALS — BP 100/62 | HR 88 | Ht 61.0 in | Wt 109.6 lb

## 2014-06-27 DIAGNOSIS — I471 Supraventricular tachycardia: Secondary | ICD-10-CM

## 2014-06-27 NOTE — Progress Notes (Signed)
   HPI The patient presents for presents for evaluation of chest pain and tachycardia. She has no past cardiac history. She is on medications for ADHD.  I evaluated her for rapid heart rate. However, walking on a treadmill although her heart rate increased fairly rapidly, it leveled off.  She never went above 100% target in slow down quickly. There was no evidence of any other triggers dysrhythmia. She's not describing any chest discomfort currently. She's had no new cardiovascular complaints.  No Known Allergies  Current Outpatient Prescriptions  Medication Sig Dispense Refill  . desogestrel-ethinyl estradiol (APRI,EMOQUETTE,SOLIA) 0.15-30 MG-MCG tablet Take 1 tablet by mouth daily. 1 Package 11  . fluticasone (FLONASE) 50 MCG/ACT nasal spray Place 2 sprays into both nostrils daily. 16 g 12  . levocetirizine (XYZAL) 5 MG tablet Take 5 mg by mouth daily as needed.     Marland Kitchen. lisdexamfetamine (VYVANSE) 50 MG capsule Take 1 capsule (50 mg total) by mouth every morning. 90 capsule 0  . Loratadine (CLARITIN PO) Take by mouth daily as needed.     . Montelukast Sodium (SINGULAIR PO) Take by mouth daily as needed.     . Multiple Vitamin (MULTIVITAMIN) tablet Take 1 tablet by mouth daily.     No current facility-administered medications for this visit.    Past Medical History  Diagnosis Date  . ADD (attention deficit disorder)   . Seasonal allergies   . Acne     Past Surgical History  Procedure Laterality Date  . Tympanostomy tube placement    . Adenoidectomy    . Mouth surgery       ROS:  As stated in the HPI and negative for all other systems.  PHYSICAL EXAM BP 100/62 mmHg  Pulse 88  Ht 5\' 1"  (1.549 m)  Wt 109 lb 9.6 oz (49.714 kg)  BMI 20.72 kg/m2 GENERAL:  Well appearing NECK:  No jugular venous distention, waveform within normal limits, carotid upstroke brisk and symmetric, no bruits, no thyromegaly LUNGS:  Clear to auscultation bilaterally CHEST:  Unremarkable HEART:  PMI not  displaced or sustained,S1 and S2 within normal limits, no S3, no S4, no clicks, no rubs, no murmurs ABD:  Flat, positive bowel sounds normal in frequency in pitch, no bruits, no rebound, no guarding, no midline pulsatile mass, no hepatomegaly, no splenomegaly    ASSESSMENT AND PLAN  CHEST PAIN:  This is atypical.  No further cardiovascular testing is indicated.  I do not suspect a cardiac etiology.   TACHYCARDIA:  No automatic or triggered dysrhythmia.  I have prescribed exercise.

## 2014-06-27 NOTE — Patient Instructions (Signed)
Your physician recommends that you schedule a follow-up appointment in: as needed with Dr. Hochrein  

## 2014-08-10 ENCOUNTER — Telehealth: Payer: Self-pay

## 2014-08-10 DIAGNOSIS — F988 Other specified behavioral and emotional disorders with onset usually occurring in childhood and adolescence: Secondary | ICD-10-CM

## 2014-08-10 MED ORDER — LISDEXAMFETAMINE DIMESYLATE 50 MG PO CAPS: 50.0000 mg | ORAL_CAPSULE | ORAL | Status: DC

## 2014-08-10 NOTE — Telephone Encounter (Signed)
Pt in need of her VYVANSE 50mg s. Please call [phone] when ready for pick up

## 2014-08-10 NOTE — Telephone Encounter (Signed)
cardiol cons good Meds ordered this encounter  Medications  . lisdexamfetamine (VYVANSE) 50 MG capsule    Sig: Take 1 capsule (50 mg total) by mouth every morning.    Dispense:  90 capsule    Refill:  0    90 d supply

## 2014-08-11 NOTE — Telephone Encounter (Signed)
Pt advised she may pick up on Saturday.

## 2014-09-26 ENCOUNTER — Other Ambulatory Visit: Payer: Self-pay | Admitting: Gynecology

## 2014-10-05 ENCOUNTER — Ambulatory Visit (INDEPENDENT_AMBULATORY_CARE_PROVIDER_SITE_OTHER): Payer: BLUE CROSS/BLUE SHIELD | Admitting: Internal Medicine

## 2014-10-05 ENCOUNTER — Encounter: Payer: Self-pay | Admitting: Internal Medicine

## 2014-10-05 VITALS — BP 124/80 | HR 87 | Temp 98.6°F | Resp 16 | Ht 61.0 in | Wt 111.6 lb

## 2014-10-05 DIAGNOSIS — J302 Other seasonal allergic rhinitis: Secondary | ICD-10-CM

## 2014-10-05 DIAGNOSIS — F909 Attention-deficit hyperactivity disorder, unspecified type: Secondary | ICD-10-CM | POA: Diagnosis not present

## 2014-10-05 DIAGNOSIS — F988 Other specified behavioral and emotional disorders with onset usually occurring in childhood and adolescence: Secondary | ICD-10-CM

## 2014-10-05 MED ORDER — LISDEXAMFETAMINE DIMESYLATE 50 MG PO CAPS: 50.0000 mg | ORAL_CAPSULE | Freq: Every day | ORAL | Status: DC

## 2014-10-05 MED ORDER — MONTELUKAST SODIUM 10 MG PO TABS: 10.0000 mg | ORAL_TABLET | Freq: Every day | ORAL | Status: DC

## 2014-10-05 MED ORDER — LISDEXAMFETAMINE DIMESYLATE 50 MG PO CAPS: 50.0000 mg | ORAL_CAPSULE | ORAL | Status: DC

## 2014-10-05 NOTE — Progress Notes (Signed)
   Subjective:    Patient ID: Ariel Jones, female    DOB: 09-17-1990, 24 y.o.   MRN: [id number]  HPI here for follow-up for attention deficit disorder Noted at last visit she was referred to cardiology for palpitations and episodic chest pain. Workup was negative. She now wonders that the possibility of exercise-induced asthma. She generally has some symptoms of shortness of breath within 10 minutes of beginning exercise and occasionally coughs at that point but has never had asthma and is not known to have any wheezing. She's never been treated with an inhaler. She does have significant seasonal allergens.  She is currently on xyzal  and Flonase. Her allergies and increased over the last 3 weeks. She has been on Singulair in the past but not recently. She often uses other antihistamines in sequence.  From an ADD standpoint she continues to respond well to medications without side effects. 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  Sister Irving Burtonmily moving to [place] with boyfriend Weston Brassick  Review of Systems No headaches or vision changes No further chest pain No GI disturbances GU negative     Objective:   Physical Exam BP 124/80 mmHg  Pulse 87  Temp(Src) 98.6 F (37 C) (Oral)  Resp 16  Ht 5\' 1"  (1.549 m)  Wt 111 lb 9.6 oz (50.621 kg)  BMI 21.10 kg/m2  SpO2 99%  LMP 09/22/2014 HEENT shows conj injection/boggy turbinates Heart regular without murmur click  Chest clear to auscultation including no wheezing on forced expiration Neurological intact Mood good affect appropriate thought content normal      Assessment & Plan:  ADD (attention deficit disorder) - Plan: lisdexamfetamine (VYVANSE) 50 MG capsule  Seasonal allergies  Meds ordered this encounter  Medications  . lisdexamfetamine (VYVANSE) 50 MG capsule    Sig: Take 1 capsule (50 mg total) by mouth every morning.    Dispense:  90  capsule    Refill:  0    90 d supply  . lisdexamfetamine (VYVANSE) 50 MG capsule    Sig: Take 1 capsule (50 mg total) by mouth daily. For 30d after signed    Dispense:  30 capsule    Refill:  0  . lisdexamfetamine (VYVANSE) 50 MG capsule    Sig: Take 1 capsule (50 mg total) by mouth daily. For 60days after signed    Dispense:  50 capsule    Refill:  0  . montelukast (SINGULAIR) 10 MG tablet    Sig: Take 1 tablet (10 mg total) by mouth at bedtime.    Dispense:  30 tablet    Refill:  11   We will consider a trial of preexercise albuterol if her problems are not resolved by Singulair  Call in 3 months/ follow-up in 6 months

## 2014-10-19 ENCOUNTER — Other Ambulatory Visit: Payer: Self-pay | Admitting: Family Medicine

## 2014-10-19 DIAGNOSIS — F988 Other specified behavioral and emotional disorders with onset usually occurring in childhood and adolescence: Secondary | ICD-10-CM

## 2014-10-19 MED ORDER — LISDEXAMFETAMINE DIMESYLATE 50 MG PO CAPS: 50.0000 mg | ORAL_CAPSULE | Freq: Every day | ORAL | Status: DC

## 2014-10-19 MED ORDER — LISDEXAMFETAMINE DIMESYLATE 50 MG PO CAPS: 50.0000 mg | ORAL_CAPSULE | ORAL | Status: DC

## 2014-11-29 ENCOUNTER — Ambulatory Visit (INDEPENDENT_AMBULATORY_CARE_PROVIDER_SITE_OTHER): Payer: BLUE CROSS/BLUE SHIELD | Admitting: Gynecology

## 2014-11-29 ENCOUNTER — Encounter: Payer: Self-pay | Admitting: Gynecology

## 2014-11-29 VITALS — BP 112/76 | Ht 61.0 in | Wt 114.0 lb

## 2014-11-29 DIAGNOSIS — Z01419 Encounter for gynecological examination (general) (routine) without abnormal findings: Secondary | ICD-10-CM

## 2014-11-29 MED ORDER — NORETHINDRONE ACET-ETHINYL EST 1-20 MG-MCG PO TABS: 1.0000 | ORAL_TABLET | Freq: Every day | ORAL | Status: DC

## 2014-11-29 NOTE — Patient Instructions (Signed)
You may obtain a copy of any labs that were done today by logging onto MyChart as outlined in the instructions provided with your AVS (after visit summary). The office will not call with normal lab results but certainly if there are any significant abnormalities then we will contact you.   Health Maintenance, Female A healthy lifestyle and preventative care can promote health and wellness.  Maintain regular health, dental, and eye exams.  Eat a healthy diet. Foods like vegetables, fruits, whole grains, low-fat dairy products, and lean protein foods contain the nutrients you need without too many calories. Decrease your intake of foods high in solid fats, added sugars, and salt. Get information about a proper diet from your caregiver, if necessary.  Regular physical exercise is one of the most important things you can do for your health. Most adults should get at least 150 minutes of moderate-intensity exercise (any activity that increases your heart rate and causes you to sweat) each week. In addition, most adults need muscle-strengthening exercises on 2 or more days a week.   Maintain a healthy weight. The body mass index (BMI) is a screening tool to identify possible weight problems. It provides an estimate of body fat based on height and weight. Your caregiver can help determine your BMI, and can help you achieve or maintain a healthy weight. For adults 20 years and older:  A BMI below 18.5 is considered underweight.  A BMI of 18.5 to 24.9 is normal.  A BMI of 25 to 29.9 is considered overweight.  A BMI of 30 and above is considered obese.  Maintain normal blood lipids and cholesterol by exercising and minimizing your intake of saturated fat. Eat a balanced diet with plenty of fruits and vegetables. Blood tests for lipids and cholesterol should begin at age 61 and be repeated every 5 years. If your lipid or cholesterol levels are high, you are over 50, or you are a high risk for heart  disease, you may need your cholesterol levels checked more frequently.Ongoing high lipid and cholesterol levels should be treated with medicines if diet and exercise are not effective.  If you smoke, find out from your caregiver how to quit. If you do not use tobacco, do not start.  Lung cancer screening is recommended for adults aged 33 80 years who are at high risk for developing lung cancer because of a history of smoking. Yearly low-dose computed tomography (CT) is recommended for people who have at least a 30-pack-year history of smoking and are a current smoker or have quit within the past 15 years. A pack year of smoking is smoking an average of 1 pack of cigarettes a day for 1 year (for example: 1 pack a day for 30 years or 2 packs a day for 15 years). Yearly screening should continue until the smoker has stopped smoking for at least 15 years. Yearly screening should also be stopped for people who develop a health problem that would prevent them from having lung cancer treatment.  If you are pregnant, do not drink alcohol. If you are breastfeeding, be very cautious about drinking alcohol. If you are not pregnant and choose to drink alcohol, do not exceed 1 drink per day. One drink is considered to be 12 ounces (355 mL) of beer, 5 ounces (148 mL) of wine, or 1.5 ounces (44 mL) of liquor.  Avoid use of street drugs. Do not share needles with anyone. Ask for help if you need support or instructions about stopping  the use of drugs.  High blood pressure causes heart disease and increases the risk of stroke. Blood pressure should be checked at least every 1 to 2 years. Ongoing high blood pressure should be treated with medicines, if weight loss and exercise are not effective.  If you are 59 to 24 years old, ask your caregiver if you should take aspirin to prevent strokes.  Diabetes screening involves taking a blood sample to check your fasting blood sugar level. This should be done once every 3  years, after age 91, if you are within normal weight and without risk factors for diabetes. Testing should be considered at a younger age or be carried out more frequently if you are overweight and have at least 1 risk factor for diabetes.  Breast cancer screening is essential preventative care for women. You should practice "breast self-awareness." This means understanding the normal appearance and feel of your breasts and may include breast self-examination. Any changes detected, no matter how small, should be reported to a caregiver. Women in their 66s and 30s should have a clinical breast exam (CBE) by a caregiver as part of a regular health exam every 1 to 3 years. After age 101, women should have a CBE every year. Starting at age 100, women should consider having a mammogram (breast X-ray) every year. Women who have a family history of breast cancer should talk to their caregiver about genetic screening. Women at a high risk of breast cancer should talk to their caregiver about having an MRI and a mammogram every year.  Breast cancer gene (BRCA)-related cancer risk assessment is recommended for women who have family members with BRCA-related cancers. BRCA-related cancers include breast, ovarian, tubal, and peritoneal cancers. Having family members with these cancers may be associated with an increased risk for harmful changes (mutations) in the breast cancer genes BRCA1 and BRCA2. Results of the assessment will determine the need for genetic counseling and BRCA1 and BRCA2 testing.  The Pap test is a screening test for cervical cancer. Women should have a Pap test starting at age 57. Between ages 25 and 35, Pap tests should be repeated every 2 years. Beginning at age 37, you should have a Pap test every 3 years as long as the past 3 Pap tests have been normal. If you had a hysterectomy for a problem that was not cancer or a condition that could lead to cancer, then you no longer need Pap tests. If you are  between ages 50 and 76, and you have had normal Pap tests going back 10 years, you no longer need Pap tests. If you have had past treatment for cervical cancer or a condition that could lead to cancer, you need Pap tests and screening for cancer for at least 20 years after your treatment. If Pap tests have been discontinued, risk factors (such as a new sexual partner) need to be reassessed to determine if screening should be resumed. Some women have medical problems that increase the chance of getting cervical cancer. In these cases, your caregiver may recommend more frequent screening and Pap tests.  The human papillomavirus (HPV) test is an additional test that may be used for cervical cancer screening. The HPV test looks for the virus that can cause the cell changes on the cervix. The cells collected during the Pap test can be tested for HPV. The HPV test could be used to screen women aged 44 years and older, and should be used in women of any age  who have unclear Pap test results. After the age of 55, women should have HPV testing at the same frequency as a Pap test.  Colorectal cancer can be detected and often prevented. Most routine colorectal cancer screening begins at the age of 44 and continues through age 20. However, your caregiver may recommend screening at an earlier age if you have risk factors for colon cancer. On a yearly basis, your caregiver may provide home test kits to check for hidden blood in the stool. Use of a small camera at the end of a tube, to directly examine the colon (sigmoidoscopy or colonoscopy), can detect the earliest forms of colorectal cancer. Talk to your caregiver about this at age 86, when routine screening begins. Direct examination of the colon should be repeated every 5 to 10 years through age 13, unless early forms of pre-cancerous polyps or small growths are found.  Hepatitis C blood testing is recommended for all people born from [birth year] through 1965 and any  individual with known risks for hepatitis C.  Practice safe sex. Use condoms and avoid high-risk sexual practices to reduce the spread of sexually transmitted infections (STIs). Sexually active women aged 36 and younger should be checked for Chlamydia, which is a common sexually transmitted infection. Older women with new or multiple partners should also be tested for Chlamydia. Testing for other STIs is recommended if you are sexually active and at increased risk.  Osteoporosis is a disease in which the bones lose minerals and strength with aging. This can result in serious bone fractures. The risk of osteoporosis can be identified using a bone density scan. Women ages 20 and over and women at risk for fractures or osteoporosis should discuss screening with their caregivers. Ask your caregiver whether you should be taking a calcium supplement or vitamin D to reduce the rate of osteoporosis.  Menopause can be associated with physical symptoms and risks. Hormone replacement therapy is available to decrease symptoms and risks. You should talk to your caregiver about whether hormone replacement therapy is right for you.  Use sunscreen. Apply sunscreen liberally and repeatedly throughout the day. You should seek shade when your shadow is shorter than you. Protect yourself by wearing long sleeves, pants, a wide-brimmed hat, and sunglasses year round, whenever you are outdoors.  Notify your caregiver of new moles or changes in moles, especially if there is a change in shape or color. Also notify your caregiver if a mole is larger than the size of a pencil eraser.  Stay current with your immunizations. Document Released: 01/14/2011 Document Revised: 10/26/2012 Document Reviewed: 01/14/2011 [facility] Patient Information 2014 [employer].

## 2014-11-29 NOTE — Progress Notes (Signed)
Ariel Jones May 05, 1991 [id number]        24 y.o.  G0P0 for annual exam.  Overall doing well. Several issues noted below.  Past medical history,surgical history, problem list, medications, allergies, family history and social history were all reviewed and documented as reviewed in the EPIC chart.  ROS:  Performed with pertinent positives and negatives included in the history, assessment and plan.   Additional significant findings :  none   Exam: [facility] personassistant Filed Vitals:   11/29/14 1414  BP: 112/76  Height: 5\' 1"  (1.549 m)  Weight: 114 lb (51.71 kg)   General appearance:  Normal affect, orientation and appearance. Skin: Grossly normal HEENT: Without gross lesions.  No cervical or supraclavicular adenopathy. Thyroid normal.  Lungs:  Clear without wheezing, rales or rhonchi Cardiac: RR, without RMG Abdominal:  Soft, nontender, without masses, guarding, rebound, organomegaly or hernia Breasts:  Examined lying and sitting without masses, retractions, discharge or axillary adenopathy. Pelvic:  Ext/BUS/vagina normal  Cervix normal  Uterus anteverted, normal size, shape and contour, midline and mobile nontender   Adnexa  Without masses or tenderness    Anus and perineum  Normal    Assessment/Plan:  24 y.o. G0P0 female for annual exam with regular menses, oral contraceptives..   1. Patient is on Yazmin equivalent for acne.  Notes that she will bleed for several days when her period is due it will stop a day and then she'll spot or bleed lightly for 2 days. She did try every other months withdrawal but had very heavy menses with this. Otherwise is doing well with the pill. Options to include continuing on the Yasmin versus switching the pill discussed. Ultimately we both decided to go ahead and try switching to a Loestrin 120 equivalent. We'll see how she does with this and then go from there. Annual prescription was written and assume a she does well then she'll see me in a year. If  she has any issue she'll call sooner. 2. STD screening. Patient remains virginal. 3. Pap smear 2014. No Pap smear done today.plan repeat Pap smear next year at three-year interval progression screening guidelines. 4. Breast health. SBE monthly reviewed. 5. Gardasil series reportedly received. 6. Health maintenance. No routine blood work done. She has no signs or symptoms to warrant screening at this time. Follow up in one year, sooner as needed.     Dara LordsFONTAINE,TIMOTHY P MD, 2:36 PM 11/29/2014

## 2014-11-30 LAB — URINALYSIS W MICROSCOPIC + REFLEX CULTURE

## 2015-02-01 ENCOUNTER — Telehealth: Payer: Self-pay | Admitting: *Deleted

## 2015-02-01 NOTE — Telephone Encounter (Signed)
Patient informed. 

## 2015-02-01 NOTE — Telephone Encounter (Signed)
(  pt aware you are out of the office) Pt has been taking Loestrin 1/20 prescribed on OV 11/29/14. Pt said this is 2nd month of being on pills, and c/o continuous bloating and brownish discharge, no odor, spotting as well. I explained to pt that spotting can be normal when starting new pills. Pt was told to call and relay any issues with you regarding new Rx. Please advise

## 2015-02-01 NOTE — Telephone Encounter (Signed)
Give it one more month.  If continues then call and we will switch pills.

## 2015-02-09 ENCOUNTER — Telehealth: Payer: Self-pay | Admitting: *Deleted

## 2015-02-09 MED ORDER — NORGESTIMATE-ETH ESTRADIOL 0.25-35 MG-MCG PO TABS: 1.0000 | ORAL_TABLET | Freq: Every day | ORAL | Status: DC

## 2015-02-09 NOTE — Telephone Encounter (Signed)
Pt informed with the below note, Rx sent. 

## 2015-02-09 NOTE — Telephone Encounter (Signed)
Pt called to follow from telephone encounter 02/01/15 states she feels extremely fatigue, sick with current birth control pill. She not getting any sleep, pt asked can she please switch to other pill? Please advise

## 2015-02-09 NOTE — Telephone Encounter (Signed)
Switch to Sprintec equivalent and see if this doesn't help.

## 2015-02-28 ENCOUNTER — Other Ambulatory Visit: Payer: Self-pay

## 2015-02-28 DIAGNOSIS — F988 Other specified behavioral and emotional disorders with onset usually occurring in childhood and adolescence: Secondary | ICD-10-CM

## 2015-02-28 MED ORDER — LISDEXAMFETAMINE DIMESYLATE 50 MG PO CAPS: 50.0000 mg | ORAL_CAPSULE | Freq: Every day | ORAL | Status: DC

## 2015-02-28 NOTE — Telephone Encounter (Signed)
Meds ordered this encounter  Medications  . lisdexamfetamine (VYVANSE) 50 MG capsule    Sig: Take 1 capsule (50 mg total) by mouth daily. 3 month supply    Dispense:  90 capsule    Refill:  0   ?preauth

## 2015-02-28 NOTE — Telephone Encounter (Signed)
Pt needs a 3 month 90 day supply of (VYVANSE) 50 MG capsule. She said that prior authorization is required and here is the number [id number].  Please advise [phone]

## 2015-03-01 NOTE — Telephone Encounter (Signed)
LMOM that rx is ready for p/u 

## 2015-03-03 NOTE — Telephone Encounter (Signed)
I completed PA on covermymeds. I had called pt to find out what other ADD meds she tried in past, but she could not remember them all. She reported that we have all of the records and she has been taking meds since 1 st grade. I reviewed paper chart and could find records of Adderall, Adderall XR, Ritalin, Ritalin SR, Concerta, Daytrana, and Dexedrine. PA pending.

## 2015-03-06 NOTE — Telephone Encounter (Signed)
PA approved through 03/04/16. Notified pt of approval.

## 2015-04-05 ENCOUNTER — Ambulatory Visit: Payer: BLUE CROSS/BLUE SHIELD | Admitting: Internal Medicine

## 2015-04-12 ENCOUNTER — Encounter: Payer: Self-pay | Admitting: Internal Medicine

## 2015-04-12 ENCOUNTER — Ambulatory Visit (INDEPENDENT_AMBULATORY_CARE_PROVIDER_SITE_OTHER): Payer: BLUE CROSS/BLUE SHIELD | Admitting: Internal Medicine

## 2015-04-12 VITALS — BP 112/70 | HR 100 | Temp 99.0°F | Resp 16 | Ht 60.5 in | Wt 111.4 lb

## 2015-04-12 DIAGNOSIS — F909 Attention-deficit hyperactivity disorder, unspecified type: Secondary | ICD-10-CM | POA: Diagnosis not present

## 2015-04-12 DIAGNOSIS — J302 Other seasonal allergic rhinitis: Secondary | ICD-10-CM | POA: Diagnosis not present

## 2015-04-12 DIAGNOSIS — F988 Other specified behavioral and emotional disorders with onset usually occurring in childhood and adolescence: Secondary | ICD-10-CM

## 2015-04-12 MED ORDER — LISDEXAMFETAMINE DIMESYLATE 50 MG PO CAPS: 50.0000 mg | ORAL_CAPSULE | Freq: Every day | ORAL | Status: DC

## 2015-04-12 MED ORDER — MONTELUKAST SODIUM 10 MG PO TABS: 10.0000 mg | ORAL_TABLET | Freq: Every day | ORAL | Status: DC

## 2015-04-12 NOTE — Progress Notes (Addendum)
Subjective:    Patient ID: Ariel Jones, female    DOB: Jun 13, 1991, 24 y.o.   MRN: [id number]  HPI This is a very pleasant 24 yo female who presents today for follow up of ADD, palpitations, seasonal allergies.   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  She has been doing well on current dose of Vyvanse. Has adequate ability to focus and concentrate. She is currently taking a microbiology class at [employer].   She has been more consistently taking her flonase and singular and has had better symptom relief.   Past Medical History  Diagnosis Date  . ADD (attention deficit disorder)   . Seasonal allergies   . Acne    Past Surgical History  Procedure Laterality Date  . Tympanostomy tube placement    . Adenoidectomy    . Mouth surgery     Family History  Problem Relation Age of Onset  . Hypertension Father   . Hypertension Mother   . Cancer Maternal Grandmother     Melanoma  . Cancer Maternal Grandfather     Bladder cancer  . CAD Paternal Grandmother   . Cancer Paternal Grandmother     Melanoma  . CAD Paternal Grandfather     Died age MI 78   Social History  Substance Use Topics  . Smoking status: Never Smoker   . Smokeless tobacco: None  . Alcohol Use: 8.4 oz/week    14 Standard drinks or equivalent per week   Review of Systems No palpitations, no chest pain, no SOB, sleeps well, feels rested    Objective:   Physical Exam Physical Exam  Constitutional: Oriented to person, place, and time. She appears well-developed and well-nourished.  HENT:  Head: Normocephalic and atraumatic.  Eyes: Conjunctivae are normal.  Neck: Normal range of motion. Neck supple.  Cardiovascular: Normal rate, regular rhythm and normal heart sounds.   Pulmonary/Chest: Effort normal and breath sounds normal.  Musculoskeletal: Normal range of motion. No edema.  Neurological: Alert and oriented to person, place, and time.    Skin: Skin is warm and dry.  Psychiatric: Normal mood and affect. Behavior is normal. Judgment and thought content normal.  Vitals reviewed. BP 112/70 mmHg  Pulse 100  Temp(Src) 99 F (37.2 C) (Oral)  Resp 16  Ht 5' 0.5" (1.537 m)  Wt 111 lb 6.4 oz (50.531 kg)  BMI 21.39 kg/m2  SpO2 100%  LMP 04/01/2015 Wt Readings from Last 3 Encounters:  04/12/15 111 lb 6.4 oz (50.531 kg)  11/29/14 114 lb (51.71 kg)  10/05/14 111 lb 9.6 oz (50.621 kg)   Depression screen PHQ 2/9 04/12/2015  Decreased Interest 0  Down, Depressed, Hopeless 0  PHQ - 2 Score 0      Assessment & Plan:  1. ADD (attention deficit disorder) - 90 day supply provided per patient's request for insurance purposes- not sure that it will be filled 90 days at a time - lisdexamfetamine (VYVANSE) 50 MG capsule; Take 1 capsule (50 mg total) by mouth daily. 3 month supply  Dispense: 90 capsule; Refill: 0 - Can call in 3 months for additional 3 month refill  2. Other seasonal allergic rhinitis - montelukast (SINGULAIR) 10 MG tablet; Take 1 tablet (10 mg total) by mouth at bedtime.  Dispense: 30 tablet; Refill: 11  - Follow up in 6 months  Olean Ree, FNP-BC  [facility], [facility]  04/12/2015 1:50  PM I have participated in the care of this patient with the Advanced Practice Provider and agree with Diagnosis and Plan as documented. Robert P. Merla Riches, M.D. , Among

## 2015-05-08 ENCOUNTER — Telehealth: Payer: Self-pay | Admitting: Family Medicine

## 2015-05-08 NOTE — Telephone Encounter (Signed)
lmom of patient new appt time on 10/11/15 at 11:30

## 2015-06-12 ENCOUNTER — Encounter: Payer: Self-pay | Admitting: Internal Medicine

## 2015-07-10 ENCOUNTER — Ambulatory Visit (INDEPENDENT_AMBULATORY_CARE_PROVIDER_SITE_OTHER): Payer: BLUE CROSS/BLUE SHIELD | Admitting: Family Medicine

## 2015-07-10 VITALS — BP 112/76 | HR 90 | Temp 98.2°F | Resp 16 | Ht 61.0 in | Wt 111.0 lb

## 2015-07-10 DIAGNOSIS — R0981 Nasal congestion: Secondary | ICD-10-CM | POA: Diagnosis not present

## 2015-07-10 DIAGNOSIS — R0982 Postnasal drip: Secondary | ICD-10-CM

## 2015-07-10 MED ORDER — PREDNISONE 20 MG PO TABS
ORAL_TABLET | ORAL | Status: DC
Start: 2015-07-10 — End: 2015-08-01

## 2015-07-10 MED ORDER — IPRATROPIUM BROMIDE 0.03 % NA SOLN: 2.0000 | Freq: Four times a day (QID) | NASAL | Status: DC

## 2015-07-10 NOTE — Patient Instructions (Signed)
I hope that you are feeling better soon Use the prednisone for your ear pressure and congestion- take for 3 days, can do 6 days if needed atrovent nasal spray as needed for post nasal drainage Please call or mychart message me if you are not improving in a few days- Sooner if worse.

## 2015-07-10 NOTE — Progress Notes (Signed)
[facility] [address], [place] [address] [phone]  Date:  07/10/2015   Name:  Ariel Jones   DOB:  1990/07/18   MRN:  [id number]  PCP:  Tonye Pearson, MD    Chief Complaint: Cough and ear pressure   History of Present Illness:  Ariel Jones is a 24 y.o. very pleasant female patient who presents with the following:  Established pt here today with complaint of illness- 10- 11 days ago she noted onset of head/ sinus pressure.  "my head was like a balloon that was going to pop."  She started sudafed and her sinuses felt better, but she still has a lot of ear pressure. She has had ear issues during her childhood- had tubes, and then had her adenoids removed.  She continues to feel discomfort and intermittent pain in her ears, left more than right  She has also been coughing, has felt hoarse.   She does not feel chest congestion but does notice that she is coughing up some phlegm that sits in her throat- PND She has not noted a fever, but she does have a ST.    No vomiting  She used sudafed, delsym at home for her sx  Patient Active Problem List   Diagnosis Date Noted  . Chest pain 05/13/2014  . ADD (attention deficit disorder)   . Seasonal allergies   . Acne     Past Medical History  Diagnosis Date  . ADD (attention deficit disorder)   . Seasonal allergies   . Acne     Past Surgical History  Procedure Laterality Date  . Tympanostomy tube placement    . Adenoidectomy    . Mouth surgery      Social History  Substance Use Topics  . Smoking status: Never Smoker   . Smokeless tobacco: Never Used  . Alcohol Use: 8.4 oz/week    14 Standard drinks or equivalent per week    Family History  Problem Relation Age of Onset  . Hypertension Father   . Hypertension Mother   . Cancer Maternal Grandmother     Melanoma  . Cancer Maternal Grandfather     Bladder cancer  . CAD Paternal Grandmother   . Cancer Paternal Grandmother    Melanoma  . CAD Paternal Grandfather     Died age MI 36    No Known Allergies  Medication list has been reviewed and updated.  Current Outpatient Prescriptions on File Prior to Visit  Medication Sig Dispense Refill  . fluticasone (FLONASE) 50 MCG/ACT nasal spray Place 2 sprays into both nostrils daily. 16 g 12  . lisdexamfetamine (VYVANSE) 50 MG capsule Take 1 capsule (50 mg total) by mouth daily. 3 month supply 90 capsule 0  . Loratadine (CLARITIN PO) Take by mouth daily as needed.     . montelukast (SINGULAIR) 10 MG tablet Take 1 tablet (10 mg total) by mouth at bedtime. 30 tablet 11  . Multiple Vitamin (MULTIVITAMIN) tablet Take 1 tablet by mouth daily.    . norgestimate-ethinyl estradiol (ORTHO-CYCLEN,SPRINTEC,PREVIFEM) 0.25-35 MG-MCG tablet Take 1 tablet by mouth daily. 1 Package 9   No current facility-administered medications on file prior to visit.    Review of Systems:  As per HPI- otherwise negative. She is a Psychologist, sport and exercise at [employer] tele and step down, she plans to go to nursing school    Physical Examination: Filed Vitals:   07/10/15 0944  BP: 112/76  Pulse: 90  Temp: 98.2 F (36.8 C)  Resp: 16   Filed Vitals:   07/10/15 0944  Height: 5\' 1"  (1.549 m)  Weight: 111 lb (50.349 kg)   Body mass index is 20.98 kg/(m^2). Ideal Body Weight: Weight in (lb) to have BMI = 25: 132  GEN: WDWN, NAD, Non-toxic, A & O x 3, looks well HEENT: Atraumatic, Normocephalic. Neck supple. No masses, No LAD.  Bilateral TM show scarring and clear effusion but no bulging or redness, oropharynx normal.  PEERL,EOMI.   Nasal cavity is inflamed Ears and Nose: No external deformity. CV: RRR, No M/G/R. No JVD. No thrill. No extra heart sounds. PULM: CTA B, no wheezes, crackles, rhonchi. No retractions. No resp. distress. No accessory muscle use. EXTR: No c/c/e NEURO Normal gait.  PSYCH: Normally interactive. Conversant. Not depressed or anxious appearing.  Calm demeanor.    Assessment and  Plan: Sinus congestion - Plan: predniSONE (DELTASONE) 20 MG tablet  PND (post-nasal drip) - Plan: ipratropium (ATROVENT) 0.03 % nasal spray  Treat for ETD and pain with prednisone as she is already on a decongestant atrovent nasal If not better soon will use amox She will be in touch if not better   Signed Abbe AmsterdamJessica Mazi Brailsford, MD

## 2015-08-01 ENCOUNTER — Ambulatory Visit (INDEPENDENT_AMBULATORY_CARE_PROVIDER_SITE_OTHER): Payer: PRIVATE HEALTH INSURANCE | Admitting: Physician Assistant

## 2015-08-01 VITALS — BP 114/68 | HR 90 | Temp 98.4°F | Resp 16 | Ht 62.0 in | Wt 113.0 lb

## 2015-08-01 DIAGNOSIS — H9202 Otalgia, left ear: Secondary | ICD-10-CM | POA: Diagnosis not present

## 2015-08-01 DIAGNOSIS — H6502 Acute serous otitis media, left ear: Secondary | ICD-10-CM

## 2015-08-01 MED ORDER — PSEUDOEPHEDRINE HCL 60 MG PO TABS: 60.0000 mg | ORAL_TABLET | ORAL | Status: DC | PRN

## 2015-08-01 MED ORDER — AZITHROMYCIN 250 MG PO TABS: ORAL_TABLET | ORAL | Status: DC

## 2015-08-01 MED ORDER — PSEUDOEPHEDRINE HCL 60 MG PO TABS: 60.0000 mg | ORAL_TABLET | Freq: Four times a day (QID) | ORAL | Status: DC | PRN

## 2015-08-01 NOTE — Patient Instructions (Signed)

## 2015-08-01 NOTE — Progress Notes (Signed)
[facility] [address] [place] [address] [phone]  Date:  08/01/2015   Name:  MARCEA ROJEK   DOB:  1990-08-25   MRN:  [id number]  PCP:  Tonye Pearson, MD   Chief Complaint  Patient presents with  . Ear Pain    today, left     History of Present Illness:  ODENA MCQUAID is a 25 y.o. female patient who presents to [facility] for cc of left ear pain.   Patient states that this is in her left ear and in her jaw.  She has had this pain intermittently for 1 month.  She had appeared here 1 month ago and given ipratropium nasal spray and prednisone.  She states that none of these had helped to truly improve her symptoms.  She has tinnitus.  She does not report hearing loss.   Her congestion has cleared since 1 month ago, but the ear pain remains.  She has no drainage.  No fever.  No dysequilibrium.  Patient has a hx of ear infections.  Tubes placed and removed last in high school (~10 years ago).    Patient Active Problem List   Diagnosis Date Noted  . Chest pain 05/13/2014  . ADD (attention deficit disorder)   . Seasonal allergies   . Acne     Past Medical History  Diagnosis Date  . ADD (attention deficit disorder)   . Seasonal allergies   . Acne     Past Surgical History  Procedure Laterality Date  . Tympanostomy tube placement    . Adenoidectomy    . Mouth surgery      Social History  Substance Use Topics  . Smoking status: Never Smoker   . Smokeless tobacco: Never Used  . Alcohol Use: 8.4 oz/week    14 Standard drinks or equivalent per week    Family History  Problem Relation Age of Onset  . Hypertension Father   . Hypertension Mother   . Cancer Maternal Grandmother     Melanoma  . Cancer Maternal Grandfather     Bladder cancer  . CAD Paternal Grandmother   . Cancer Paternal Grandmother     Melanoma  . CAD Paternal Grandfather     Died age MI 57    No Known Allergies  Medication list has been reviewed and  updated.  Current Outpatient Prescriptions on File Prior to Visit  Medication Sig Dispense Refill  . fluticasone (FLONASE) 50 MCG/ACT nasal spray Place 2 sprays into both nostrils daily. 16 g 12  . ipratropium (ATROVENT) 0.03 % nasal spray Place 2 sprays into the nose 4 (four) times daily. 30 mL 6  . lisdexamfetamine (VYVANSE) 50 MG capsule Take 1 capsule (50 mg total) by mouth daily. 3 month supply 90 capsule 0  . Loratadine (CLARITIN PO) Take by mouth daily as needed.     . montelukast (SINGULAIR) 10 MG tablet Take 1 tablet (10 mg total) by mouth at bedtime. 30 tablet 11  . Multiple Vitamin (MULTIVITAMIN) tablet Take 1 tablet by mouth daily.    . norgestimate-ethinyl estradiol (ORTHO-CYCLEN,SPRINTEC,PREVIFEM) 0.25-35 MG-MCG tablet Take 1 tablet by mouth daily. 1 Package 9   No current facility-administered medications on file prior to visit.    ROS ROS otherwise unremarkable unless listed above.  Physical Examination: BP 114/68 mmHg  Pulse 90  Temp(Src) 98.4 F (36.9 C)  Resp 16  Ht  (1.575 m)  Wt 113 lb (51.256 kg)  BMI 20.66 kg/m2  SpO2 96%  LMP 07/20/2015 (Approximate) Ideal Body Weight: Weight in (lb) to have BMI = 25: 136.4  Physical Exam  Constitutional: She is oriented to person, place, and time. She appears well-developed and well-nourished. No distress.  HENT:  Head: Normocephalic and atraumatic.  Right Ear: Tympanic membrane, external ear and ear canal normal.  Left Ear: External ear and ear canal normal. Tympanic membrane is bulging. A middle ear effusion is present.  Nose: Mucosal edema and rhinorrhea present. Right sinus exhibits no maxillary sinus tenderness and no frontal sinus tenderness. Left sinus exhibits no maxillary sinus tenderness and no frontal sinus tenderness.  Mouth/Throat: No uvula swelling. No oropharyngeal exudate, posterior oropharyngeal edema or posterior oropharyngeal erythema.  Left ear with a bulbous like appearance at the 4 o'clock,  however moving toward the 9--there is some retraction.  Eyes: Conjunctivae and EOM are normal. Pupils are equal, round, and reactive to light.  Cardiovascular: Normal rate and regular rhythm.  Exam reveals no gallop, no distant heart sounds and no friction rub.   No murmur heard. Pulmonary/Chest: Effort normal. No respiratory distress. She has no decreased breath sounds. She has no wheezes. She has no rhonchi.  Lymphadenopathy:       Head (right side): No submandibular, no tonsillar, no preauricular and no posterior auricular adenopathy present.       Head (left side): No submandibular, no tonsillar, no preauricular and no posterior auricular adenopathy present.  Neurological: She is alert and oriented to person, place, and time.  Skin: She is not diaphoretic.  Psychiatric: She has a normal mood and affect. Her behavior is normal.      Assessment and Plan: AMARE KONTOS is a 25 y.o. female who is here today for ear pain, left. Starting azithromycin. Advised sudafed along with precautions.  Acute serous otitis media of left ear, recurrence not specified - Plan: azithromycin (ZITHROMAX) 250 MG tablet, pseudoephedrine (SUDAFED) 60 MG tablet  Left ear pain - Plan: azithromycin (ZITHROMAX) 250 MG tablet, pseudoephedrine (SUDAFED) 60 MG tablet,   Trena Platt, PA-C [facility] 08/01/2015 8:16 PM

## 2015-08-11 ENCOUNTER — Telehealth: Payer: Self-pay

## 2015-08-11 DIAGNOSIS — H9202 Otalgia, left ear: Secondary | ICD-10-CM

## 2015-08-11 DIAGNOSIS — H6592 Unspecified nonsuppurative otitis media, left ear: Secondary | ICD-10-CM

## 2015-08-11 NOTE — Telephone Encounter (Signed)
Ariel Jones   Patient is NOT any better and would like you to refer her to Dr. Haroldine Laws - ENT   [phone]

## 2015-08-12 NOTE — Telephone Encounter (Signed)
Absolutely, please advise that I will place an urgent referral to revisit with Dr. Haroldine Laws.

## 2015-08-13 NOTE — Telephone Encounter (Signed)
LMOM that referral was made

## 2015-08-23 ENCOUNTER — Other Ambulatory Visit: Payer: Self-pay | Admitting: Family Medicine

## 2015-08-23 ENCOUNTER — Other Ambulatory Visit: Payer: Self-pay

## 2015-08-23 DIAGNOSIS — F988 Other specified behavioral and emotional disorders with onset usually occurring in childhood and adolescence: Secondary | ICD-10-CM

## 2015-08-23 MED ORDER — LISDEXAMFETAMINE DIMESYLATE 50 MG PO CAPS: 50.0000 mg | ORAL_CAPSULE | Freq: Every day | ORAL | Status: DC

## 2015-08-23 NOTE — Telephone Encounter (Signed)
Please let patient know her prescription is ready for pick up at 104.

## 2015-08-23 NOTE — Telephone Encounter (Signed)
Pt is requesting a refill on her vyvanse for 3 months   Best number [phone]

## 2015-08-24 ENCOUNTER — Other Ambulatory Visit: Payer: Self-pay | Admitting: *Deleted

## 2015-08-24 MED ORDER — NORGESTIMATE-ETH ESTRADIOL 0.25-35 MG-MCG PO TABS: 1.0000 | ORAL_TABLET | Freq: Every day | ORAL | Status: DC

## 2015-08-24 NOTE — Telephone Encounter (Signed)
Pt called stating no refills on birth control pills at pharamacy, I called pt and left on voicemail to call me with what pharmacy to send Rx.

## 2015-08-24 NOTE — Telephone Encounter (Signed)
Pt called back and said [facility]

## 2015-08-30 ENCOUNTER — Encounter: Payer: Self-pay | Admitting: Physician Assistant

## 2015-08-30 DIAGNOSIS — J309 Allergic rhinitis, unspecified: Secondary | ICD-10-CM | POA: Insufficient documentation

## 2015-08-30 NOTE — Telephone Encounter (Signed)
Pt called and Corrie Dandy let her know ready at 13.

## 2015-09-05 ENCOUNTER — Telehealth: Payer: Self-pay

## 2015-09-05 NOTE — Telephone Encounter (Signed)
PA completed for Vyvanse 50 mg QD on covermymeds. Pending.

## 2015-09-06 NOTE — Telephone Encounter (Signed)
Pt had LM on my VM asking about status of this PA. I called her back and advised that we are just waiting to hear back from ins com, and I will notify her as soon as I get an answer.

## 2015-09-08 ENCOUNTER — Telehealth: Payer: Self-pay

## 2015-09-08 NOTE — Telephone Encounter (Signed)
Pt wants you-Sabena to know her insurance comp has denied her Vyvanse. They are saying her ADHA  is not documented properly. She would like [place] to resubmit this script with it documented "properly". Please advise at [phone]

## 2015-09-08 NOTE — Telephone Encounter (Signed)
Pt called to check the status//req. DT to B.[facility]li[facility]   [phone]

## 2015-09-08 NOTE — Telephone Encounter (Signed)
Called [employer] to find out what the problem is since the form was filled out correctly. They stated that the form should not have advised that no further questions were needed. They needed the answer to one more question. I was able to complete it on the phone and it was approved for 36 mos, case # [id number]. Notified pt.

## 2015-10-11 ENCOUNTER — Ambulatory Visit (INDEPENDENT_AMBULATORY_CARE_PROVIDER_SITE_OTHER): Payer: PRIVATE HEALTH INSURANCE | Admitting: Internal Medicine

## 2015-10-11 VITALS — BP 94/63 | HR 81 | Temp 98.3°F | Resp 16 | Ht 62.0 in | Wt 113.0 lb

## 2015-10-11 DIAGNOSIS — F909 Attention-deficit hyperactivity disorder, unspecified type: Secondary | ICD-10-CM

## 2015-10-11 DIAGNOSIS — F988 Other specified behavioral and emotional disorders with onset usually occurring in childhood and adolescence: Secondary | ICD-10-CM

## 2015-10-11 MED ORDER — AMPHETAMINE-DEXTROAMPHETAMINE 10 MG PO TABS: ORAL_TABLET | ORAL | Status: DC

## 2015-10-11 MED ORDER — AMPHETAMINE-DEXTROAMPHET ER 20 MG PO CP24: 20.0000 mg | ORAL_CAPSULE | Freq: Every day | ORAL | Status: DC

## 2015-10-11 NOTE — Progress Notes (Signed)
   Subjective:    Patient ID: Ariel Jones, female    DOB: [birth year], 25 y.o.   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  very healthy in general  Review of Systems  Constitutional: Negative.   HENT: Negative.   Cardiovascular: Negative.   Neurological: Negative.   Psychiatric/Behavioral: Negative.        Objective:   Physical Exam  Constitutional: She is oriented to person, place, and time. She appears well-developed and well-nourished.  Eyes: EOM are normal. Pupils are equal, round, and reactive to light.  Neck: Neck supple. No thyromegaly present.  Cardiovascular: Normal rate.   Pulmonary/Chest: Effort normal.  Neurological: She is alert and oriented to person, place, and time. No cranial nerve deficit. Coordination normal.  Psychiatric: She has a normal mood and affect. Her behavior is normal. Judgment and thought content normal.   BP 94/63 mmHg  Pulse 81  Temp(Src) 98.3 F (36.8 C)  Resp 16  Ht 5\' 2"  (1.575 m)  Wt 113 lb (51.256 kg)  BMI 20.66 kg/m2     Assessment & Plan:  ADD (attention deficit disorder)  Meds ordered this encounter  Medications  . amphetamine-dextroamphetamine (ADDERALL XR) 20 MG 24 hr capsule    Sig: Take 1 capsule (20 mg total) by mouth daily.    Dispense:  30 capsule    Refill:  0  . amphetamine-dextroamphetamine (ADDERALL) 10 MG tablet    Sig: 1 at 4pm    Dispense:  30 tablet    Refill:  0   She will try both of these formulations for duration of effect, intensity of affect,  any side effects, length of efficacy, and notify me via my chart for next prescriptions For longer-term follow-up she will consider [place] [facility] or follow-up here with physician assistant Ariel Jones

## 2015-10-11 NOTE — Patient Instructions (Signed)
     IF you received an x-ray today, you will receive an invoice from [facility]. Please contact [facility] at [phone] with questions or concerns regarding your invoice.   IF you received labwork today, you will receive an invoice from [facility]. Please contact [facility] at [phone] with questions or concerns regarding your invoice.   Our billing staff will not be able to assist you with questions regarding bills from these companies.  You will be contacted with the lab results as soon as they are available. The fastest way to get your results is to activate your My Chart account. Instructions are located on the last page of this paperwork. If you have not heard from us regarding the results in 2 weeks, please contact this office.      

## 2015-10-13 ENCOUNTER — Telehealth: Payer: Self-pay

## 2015-10-13 NOTE — Telephone Encounter (Signed)
PAs completed for both generic adderall and adderall ER on covermymeds. Pending.

## 2015-10-16 NOTE — Telephone Encounter (Signed)
PA for ER 20 mg approved through 10/12/2018. Haven't heard on IR yet. I notified pharm and asked pharm to try running both.

## 2015-11-04 ENCOUNTER — Encounter: Payer: Self-pay | Admitting: Internal Medicine

## 2015-11-07 MED ORDER — METHYLPHENIDATE HCL ER (OSM) 54 MG PO TBCR: 54.0000 mg | EXTENDED_RELEASE_TABLET | ORAL | Status: DC

## 2015-11-07 NOTE — Telephone Encounter (Signed)
Meds ordered this encounter  Medications  . methylphenidate 54 MG PO CR tablet    Sig: Take 1 tablet (54 mg total) by mouth every morning.    Dispense:  30 tablet    Refill:  0

## 2015-11-13 ENCOUNTER — Telehealth: Payer: Self-pay

## 2015-11-13 NOTE — Telephone Encounter (Signed)
PA completed for Concerta and it was approved. Notified pharm.

## 2015-11-14 MED ORDER — DEXTROAMPHETAMINE SULFATE ER 10 MG PO CP24: 10.0000 mg | ORAL_CAPSULE | Freq: Every day | ORAL | Status: DC

## 2015-11-14 NOTE — Telephone Encounter (Signed)
concerta and vyvanse too expensive Meds ordered this encounter  Medications  . methylphenidate 54 MG PO CR tablet----cancel    Sig: Take 1 tablet (54 mg total) by mouth every morning.    Dispense:  30 tablet    Refill:  0  . dextroamphetamine (DEXEDRINE) 10 MG 24 hr capsule    Sig: Take 1 capsule (10 mg total) by mouth daily.    Dispense:  30 capsule    Refill:  0  Try 1 then 2 as dose should be 04-29-19 ---to mychart response

## 2015-11-14 NOTE — Telephone Encounter (Signed)
Approval dates through 11/12/2018.

## 2015-11-16 ENCOUNTER — Telehealth: Payer: Self-pay

## 2015-11-16 NOTE — Telephone Encounter (Signed)
Patient is calling to speak to Britta MccreedyBarbara because she needs a prior authorization for dexedrine

## 2015-11-17 NOTE — Telephone Encounter (Signed)
PA started on dexedrine, waiting for clin ?s to load.

## 2015-11-17 NOTE — Telephone Encounter (Signed)
PA was completed and notified pt that it is pending. Advised her, since heading into a weekend, to have her pharm try to run it through tomorrow and then again Sunday to see if it has been approved and will go through. Pt agreed.

## 2015-11-20 ENCOUNTER — Ambulatory Visit (INDEPENDENT_AMBULATORY_CARE_PROVIDER_SITE_OTHER): Payer: PRIVATE HEALTH INSURANCE | Admitting: Emergency Medicine

## 2015-11-20 ENCOUNTER — Ambulatory Visit (INDEPENDENT_AMBULATORY_CARE_PROVIDER_SITE_OTHER): Payer: PRIVATE HEALTH INSURANCE

## 2015-11-20 VITALS — BP 120/81 | HR 120 | Temp 98.7°F | Resp 18 | Ht 61.25 in | Wt 114.4 lb

## 2015-11-20 DIAGNOSIS — R0981 Nasal congestion: Secondary | ICD-10-CM

## 2015-11-20 DIAGNOSIS — J0101 Acute recurrent maxillary sinusitis: Secondary | ICD-10-CM

## 2015-11-20 DIAGNOSIS — R0982 Postnasal drip: Secondary | ICD-10-CM

## 2015-11-20 DIAGNOSIS — R000 Tachycardia, unspecified: Secondary | ICD-10-CM | POA: Diagnosis not present

## 2015-11-20 MED ORDER — CEFUROXIME AXETIL 250 MG PO TABS: 250.0000 mg | ORAL_TABLET | Freq: Two times a day (BID) | ORAL | Status: DC

## 2015-11-20 NOTE — Patient Instructions (Addendum)
   IF you received an x-ray today, you will receive an invoice from [facility]. Please contact [facility] at [phone] with questions or concerns regarding your invoice.   IF you received labwork today, you will receive an invoice from [facility]. Please contact [facility] at [phone] with questions or concerns regarding your invoice.   Our billing staff will not be able to assist you with questions regarding bills from these companies.  You will be contacted with the lab results as soon as they are available. The fastest way to get your results is to activate your My Chart account. Instructions are located on the last page of this paperwork. If you have not heard from us regarding the results in 2 weeks, please contact this office.     Sinusitis, Adult Sinusitis is redness, soreness, and inflammation of the paranasal sinuses. Paranasal sinuses are air pockets within the bones of your face. They are located beneath your eyes, in the middle of your forehead, and above your eyes. In healthy paranasal sinuses, mucus is able to drain out, and air is able to circulate through them by way of your nose. However, when your paranasal sinuses are inflamed, mucus and air can become trapped. This can allow bacteria and other germs to grow and cause infection. Sinusitis can develop quickly and last only a short time (acute) or continue over a long period (chronic). Sinusitis that lasts for more than 12 weeks is considered chronic. CAUSES Causes of sinusitis include:  Allergies.  Structural abnormalities, such as displacement of the cartilage that separates your nostrils (deviated septum), which can decrease the air flow through your nose and sinuses and affect sinus drainage.  Functional abnormalities, such as when the small hairs (cilia) that line your sinuses and help remove mucus do not work properly or are not present. SIGNS AND SYMPTOMS Symptoms  of acute and chronic sinusitis are the same. The primary symptoms are pain and pressure around the affected sinuses. Other symptoms include:  Upper toothache.  Earache.  Headache.  Bad breath.  Decreased sense of smell and taste.  A cough, which worsens when you are lying flat.  Fatigue.  Fever.  Thick drainage from your nose, which often is green and may contain pus (purulent).  Swelling and warmth over the affected sinuses. DIAGNOSIS Your health care provider will perform a physical exam. During your exam, your health care provider may perform any of the following to help determine if you have acute sinusitis or chronic sinusitis:  Look in your nose for signs of abnormal growths in your nostrils (nasal polyps).  Tap over the affected sinus to check for signs of infection.  View the inside of your sinuses using an imaging device that has a light attached (endoscope). If your health care provider suspects that you have chronic sinusitis, one or more of the following tests may be recommended:  Allergy tests.  Nasal culture. A sample of mucus is taken from your nose, sent to a lab, and screened for bacteria.  Nasal cytology. A sample of mucus is taken from your nose and examined by your health care provider to determine if your sinusitis is related to an allergy. TREATMENT Most cases of acute sinusitis are related to a viral infection and will resolve on their own within 10 days. Sometimes, medicines are prescribed to help relieve symptoms of both acute and chronic sinusitis. These may include pain medicines, decongestants, nasal steroid sprays, or saline sprays. However, for sinusitis related   to a bacterial infection, your health care provider will prescribe antibiotic medicines. These are medicines that will help kill the bacteria causing the infection. Rarely, sinusitis is caused by a fungal infection. In these cases, your health care provider will prescribe antifungal  medicine. For some cases of chronic sinusitis, surgery is needed. Generally, these are cases in which sinusitis recurs more than 3 times per year, despite other treatments. HOME CARE INSTRUCTIONS  Drink plenty of water. Water helps thin the mucus so your sinuses can drain more easily.  Use a humidifier.  Inhale steam 3-4 times a day (for example, sit in the bathroom with the shower running).  Apply a warm, moist washcloth to your face 3-4 times a day, or as directed by your health care provider.  Use saline nasal sprays to help moisten and clean your sinuses.  Take medicines only as directed by your health care provider.  If you were prescribed either an antibiotic or antifungal medicine, finish it all even if you start to feel better. SEEK IMMEDIATE MEDICAL CARE IF:  You have increasing pain or severe headaches.  You have nausea, vomiting, or drowsiness.  You have swelling around your face.  You have vision problems.  You have a stiff neck.  You have difficulty breathing.   This information is not intended to replace advice given to you by your health care provider. Make sure you discuss any questions you have with your health care provider.   Document Released: 07/01/2005 Document Revised: 07/22/2014 Document Reviewed: 07/16/2011 Elsevier Interactive Patient Education 2016 [employer]  

## 2015-11-20 NOTE — Progress Notes (Signed)
Subjective:  This chart was scribed for Lesle ChrisSteven Daub MD, by Veverly FellsHatice Demirci,scribe, at [facility].  This patient was seen in room 8 and the patient's care was started at 8:45 AM.    Chief Complaint  Patient presents with   Sinusitis    Pressure feeling in ears/head   Sore Throat   Cough    Non productive   Ear Pain    Bilateral   Nasal Congestion     Patient ID: Ariel RanBarbara L Jones, female    DOB: 04/08/1991, 25 y.o.   MRN: [id number]  HPI HPI Comments: Ariel Jones is a 25 y.o. female who presents to the [facility] complaining of a mild sore throat (was worse a couple days ago), productive cough (clear phlegm)  and post nasal drip onset last week.  She has associated symptoms of a headache, head pressure and ear pain. She had similar symptoms in the past and was treated with prednisone and sudafed which did not alleviate her symptoms.  She then returned to her doctor and was treated with a z pack which also did not help alleviate her symptoms.  She then went to and ENT doctor and was put on Ceftin which she states helped her.   She denies any possibility of a pregnancy and denies any recent sexual activity.  Patient had her adenoids removed when she was younger.     Patient was prescribed ADD medication by Dr. Merla Richesoolittle in April.     Patient Active Problem List   Diagnosis Date Noted   AR (allergic rhinitis) 08/30/2015   Chest pain 05/13/2014   ADD (attention deficit disorder)    Seasonal allergies    Acne    Past Medical History  Diagnosis Date   ADD (attention deficit disorder)    Seasonal allergies    Acne    Past Surgical History  Procedure Laterality Date   Tympanostomy tube placement     Adenoidectomy     Mouth surgery     No Known Allergies Prior to Admission medications   Medication Sig Start Date End Date Taking? Authorizing Provider  dextroamphetamine (DEXEDRINE) 10 MG 24 hr capsule Take 1 capsule  (10 mg total) by mouth daily. 11/14/15  Yes Tonye Pearsonobert P Doolittle, MD  fexofenadine-pseudoephedrine (ALLEGRA-D) 60-120 MG 12 hr tablet Take 1 tablet by mouth 2 (two) times daily.   Yes Historical Provider, MD  fluticasone (FLONASE) 50 MCG/ACT nasal spray Place 2 sprays into both nostrils daily. 11/04/13  Yes Eleanore E Egan, PA-C  Loratadine (CLARITIN PO) Take by mouth daily as needed.    Yes Historical Provider, MD  Multiple Vitamin (MULTIVITAMIN) tablet Take 1 tablet by mouth daily.   Yes Historical Provider, MD  norgestimate-ethinyl estradiol (ORTHO-CYCLEN,SPRINT.dwwEC,PREVIFEM) 0.25-35 MG-MCG tablet Take 1 tablet by mouth daily. 08/24/15  Yes Dara Lordsimothy P Fontaine, MD   Social History   Social History   Marital Status: Single    Spouse Name: N/A   Number of Children: N/A   Years of Education: N/A   Occupational History   Not on file.   Social History Main Topics   Smoking status: Never Smoker    Smokeless tobacco: Never Used   Alcohol Use: 8.4 oz/week    14 Standard drinks or equivalent per week   Drug Use: No   Sexual Activity: No     Comment: Virgin   Other Topics Concern   Not on file   Social History Narrative  Works on [employer]     Review of Systems  Constitutional: Negative for fever and chills.  HENT: Positive for congestion, ear pain, postnasal drip and sore throat.   Eyes: Negative for pain, redness and itching.  Respiratory: Positive for cough. Negative for choking and shortness of breath.   Gastrointestinal: Negative for nausea and vomiting.  Musculoskeletal: Negative for neck pain and neck stiffness.  Neurological: Positive for headaches. Negative for dizziness, syncope, speech difficulty, weakness and numbness.       Objective:   Physical Exam CONSTITUTIONAL: Well developed/well nourished HEAD: Normocephalic/atraumatic EYES: EOMI/PERRL ENMT: Mucous membranes moist, congestion but no purulence, TM's have some scarring bilaterally. NECK: supple no  meningeal signs CV: S1/S2 noted, no murmurs/rubs/gallops noted LUNGS: Lungs are clear to auscultation bilaterally, no apparent distress NEURO: Pt is awake/alert/appropriate, moves all extremitiesx4.  No facial droop.   EXTREMITIES: pulses normal/equal, full ROM SKIN: warm, color normal PSYCH: no abnormalities of mood noted, alert and oriented to situation  Filed Vitals:   11/20/15 0838  BP: 120/81  Pulse: 120  Temp: 98.7 F (37.1 C)  TempSrc: Oral  Resp: 18  Height: 5' 1.25" (1.556 m)  Weight: 114 lb 6.4 oz (51.891 kg)  SpO2: 100%    Dg Sinus 1-2 Views  11/20/2015  CLINICAL DATA:  Maxillary sinus disease EXAM: PARANASAL SINUSES - 1-2 VIEW COMPARISON:  None. FINDINGS: Water's view obtained. Paranasal sinuses appear clear on this single view. No air-fluid level. No bony destruction or expansion. Mastoids appear clear. There is minimal rightward deviation of the nasal septum. IMPRESSION: Paranasal sinuses clear. No air-fluid level. Minimal rightward deviation of nasal septum. Electronically Signed   By: Bretta Bang III M.D.   On: 11/20/2015 09:07         Assessment & Plan:  Patient will be treated with Ceftin twice a day since she responded well to that in the past. Her x-ray did not show any air-fluid levels. She will continue her Flonase inhaler. Recheck if symptoms persist.I personally performed the services described in this documentation, which was scribed in my presence. The recorded information has been reviewed and is accurate.   Collene Gobble, MD

## 2015-11-20 NOTE — Telephone Encounter (Signed)
PA approved through 11/17/2018. Notified pt.

## 2015-12-04 ENCOUNTER — Encounter: Payer: PRIVATE HEALTH INSURANCE | Admitting: Gynecology

## 2015-12-12 ENCOUNTER — Encounter: Payer: PRIVATE HEALTH INSURANCE | Admitting: Gynecology

## 2016-01-25 ENCOUNTER — Ambulatory Visit (INDEPENDENT_AMBULATORY_CARE_PROVIDER_SITE_OTHER): Payer: PRIVATE HEALTH INSURANCE | Admitting: Gynecology

## 2016-01-25 ENCOUNTER — Encounter: Payer: Self-pay | Admitting: Gynecology

## 2016-01-25 VITALS — BP 112/64 | Ht 61.0 in | Wt 115.0 lb

## 2016-01-25 DIAGNOSIS — Z01419 Encounter for gynecological examination (general) (routine) without abnormal findings: Secondary | ICD-10-CM | POA: Diagnosis not present

## 2016-01-25 LAB — CBC WITH DIFFERENTIAL/PLATELET
BASOS ABS: 54 {cells}/uL (ref 0–200)
Basophils Relative: 1 %
EOS PCT: 0 %
Eosinophils Absolute: 0 cells/uL — ABNORMAL LOW (ref 15–500)
HEMATOCRIT: 35.1 % (ref 35.0–45.0)
HEMOGLOBIN: 11.5 g/dL — AB (ref 11.7–15.5)
LYMPHS ABS: 2322 {cells}/uL (ref 850–3900)
Lymphocytes Relative: 43 %
MCH: 30 pg (ref 27.0–33.0)
MCHC: 32.8 g/dL (ref 32.0–36.0)
MCV: 91.6 fL (ref 80.0–100.0)
MONO ABS: 486 {cells}/uL (ref 200–950)
MPV: 9.2 fL (ref 7.5–12.5)
Monocytes Relative: 9 %
NEUTROS ABS: 2538 {cells}/uL (ref 1500–7800)
Neutrophils Relative %: 47 %
Platelets: 262 10*3/uL (ref 140–400)
RBC: 3.83 MIL/uL (ref 3.80–5.10)
RDW: 13.6 % (ref 11.0–15.0)
WBC: 5.4 10*3/uL (ref 3.8–10.8)

## 2016-01-25 MED ORDER — NORGESTIMATE-ETH ESTRADIOL 0.25-35 MG-MCG PO TABS: 1.0000 | ORAL_TABLET | Freq: Every day | ORAL | Status: DC

## 2016-01-25 NOTE — Progress Notes (Signed)
    Ariel Jones [birth year] [id number]        25 y.o.  G0P0  for annual exam.  Doing well without complaints.  Past medical history,surgical history, problem list, medications, allergies, family history and social history were all reviewed and documented as reviewed in the EPIC chart.  ROS:  Performed with pertinent positives and negatives included in the history, assessment and plan.   Additional significant findings :  none   Exam: Kennon PortelaKim Gardner assistant Filed Vitals:   01/25/16 1402  BP: 112/64  Height: 5\' 1"  (1.549 m)  Weight: 115 lb (52.164 kg)   General appearance:  Normal affect, orientation and appearance. Skin: Grossly normal HEENT: Without gross lesions.  No cervical or supraclavicular adenopathy. Thyroid normal.  Lungs:  Clear without wheezing, rales or rhonchi Cardiac: RR, without RMG Abdominal:  Soft, nontender, without masses, guarding, rebound, organomegaly or hernia Breasts:  Examined lying and sitting without masses, retractions, discharge or axillary adenopathy. Pelvic:  Ext/BUS/Vagina normal  Cervix normal. Pap smear done  Uterus anteverted, normal size, shape and contour, midline and mobile nontender   Adnexa without masses or tenderness    Anus and perineum normal    Assessment/Plan:  25 y.o. G0P0 female for annual exam with regular menses, oral contraceptives.   1. Patient on Sprintec equivalent for acne. Doing well and she wants to continue. Remains virginal. Refill 1 year provided. 2. STD screening not done due to virginal status. 3. Pap smear 2014. Pap smear done today. Continue with every 3 year screening interval per current screening guidelines. 4. Breast health. SBE monthly reviewed. 5. Gardasil series reportedly received. 6. Health maintenance. Baseline CBC and urinalysis ordered. Follow up 1 year, sooner as needed.  Without signs or symptoms to suggest other screening.   Dara LordsFONTAINE,Audie Wieser P MD, 2:18 PM 01/25/2016

## 2016-01-25 NOTE — Patient Instructions (Signed)

## 2016-01-25 NOTE — Addendum Note (Signed)
Addended by: Dayna BarkerGARDNER, Efosa Treichler K on: 01/25/2016 03:11 PM   Modules accepted: Orders, SmartSet

## 2016-01-26 ENCOUNTER — Encounter: Payer: Self-pay | Admitting: Gynecology

## 2016-01-26 LAB — URINALYSIS W MICROSCOPIC + REFLEX CULTURE
Bilirubin Urine: NEGATIVE
CRYSTALS: NONE SEEN [HPF]
Casts: NONE SEEN [LPF]
Glucose, UA: NEGATIVE
Ketones, ur: NEGATIVE
Nitrite: NEGATIVE
PROTEIN: NEGATIVE
SPECIFIC GRAVITY, URINE: 1.012 (ref 1.001–1.035)
YEAST: NONE SEEN [HPF]
pH: 7 (ref 5.0–8.0)

## 2016-01-26 LAB — PAP IG W/ RFLX HPV ASCU

## 2016-01-27 LAB — URINE CULTURE

## 2016-02-15 ENCOUNTER — Telehealth: Payer: Self-pay | Admitting: Gynecology

## 2016-02-15 NOTE — Telephone Encounter (Signed)
Tell patient the following message was sent to her by email but was never opened and return to [place]:  Ariel Jones,   Your blood count was just a touch low. I would recommend supplementing extra iron daily. A multivitamin with iron would be good.

## 2016-02-15 NOTE — Telephone Encounter (Signed)
Pt informed with the below note. 

## 2016-02-15 NOTE — Telephone Encounter (Signed)
Call patient and tell her I emailed her last month with the following message but it was never was never opened and returned to me:  Ariel Jones,   Your blood count was just a touch low. I would recommend supplementing extra iron daily. A multivitamin with iron would be good.     Asked patient to check the MyChart and make sure that her email options are activated so the emails go through an or not return unanswered as this works best for communication.

## 2016-02-24 ENCOUNTER — Other Ambulatory Visit: Payer: Self-pay | Admitting: Gynecology

## 2016-03-08 ENCOUNTER — Encounter: Payer: Self-pay | Admitting: *Deleted

## 2016-03-08 ENCOUNTER — Telehealth: Payer: Self-pay | Admitting: *Deleted

## 2016-03-08 NOTE — Telephone Encounter (Signed)
Pre-Visit Call completed with patient and chart updated.   Pre-Visit Info documented in Specialty Comments under SnapShot.    

## 2016-03-11 ENCOUNTER — Encounter: Payer: Self-pay | Admitting: Family Medicine

## 2016-03-11 ENCOUNTER — Ambulatory Visit (INDEPENDENT_AMBULATORY_CARE_PROVIDER_SITE_OTHER): Payer: PRIVATE HEALTH INSURANCE | Admitting: Family Medicine

## 2016-03-11 VITALS — BP 120/76 | HR 84 | Temp 98.8°F | Ht 61.0 in | Wt 116.4 lb

## 2016-03-11 DIAGNOSIS — R899 Unspecified abnormal finding in specimens from other organs, systems and tissues: Secondary | ICD-10-CM

## 2016-03-11 DIAGNOSIS — F900 Attention-deficit hyperactivity disorder, predominantly inattentive type: Secondary | ICD-10-CM | POA: Diagnosis not present

## 2016-03-11 MED ORDER — DEXTROAMPHETAMINE SULFATE ER 10 MG PO CP24: 10.0000 mg | ORAL_CAPSULE | Freq: Every day | ORAL | 0 refills | Status: DC

## 2016-03-11 NOTE — Progress Notes (Signed)
Pre visit review using our clinic review tool, if applicable. No additional management support is needed unless otherwise documented below in the visit note. 

## 2016-03-11 NOTE — Patient Instructions (Signed)
It was very nice to see you today!  Please come and see me in about 3 months to refill your dexedrine. We will do a drug screen for you then once you are back on this medication. We do also ask that you not get this type of medication from any other provider. Bring or send me a copy of your paperwork from when you tried to donate blood; I can try to figure out what your labs meant!    Take care and let me know if you have any concerns!

## 2016-03-11 NOTE — Progress Notes (Signed)
[facility] [address], [address] [place], [place] [address] [phone] [phone]  Date:  03/11/2016   Name:  Ariel RanBarbara L Jones   DOB:  12/18/1990   MRN:  [id number]  PCP:  Abbe AmsterdamOPLAND,Izac Faulkenberry, MD    Chief Complaint: Establish Care (Former pt of Dr. Merla Richesoolittle. Pt here to discuss starting back on Dextroamphetamine pt has not taken within the past 2 months. Pt will need a refill on this med. )   History of Present Illness:  Ariel Jones is a 25 y.o. very pleasant female patient who presents with the following:  History of ADHD.  She had been on dexedrine in the past for this issue.  Reviewed NCCSR (she is under [name])- last rx for dexedrine ER 10, #30 was given on 11/17/15 Also does have history of allergies.  She ended up seeing ENT for follow-up after our visit last December.  All is well in that regard   Her past PCP- Dr. Merla Richesoolittle has retired.  She would like to see me for her ADD treatment now which is fine.   She was dx with add in the kindergarten or 1st grade, she has been on meds since 1st grade.  She would like to be on vyvanse but it is too expensive, dexedrine is affordable for her.  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  She plans to attend nursing school soon, she hopes at [employer].   She notes that she went to donate blood last year- she had a positive HIV screening test but her follow-up test was negative.  She is not quite sure what this means but her understanding is that she does NOT have HIV- however she wonders what exactly these labs meant.  She plans to get me this paperwork and I will look over it.   Patient Active Problem List   Diagnosis Date Noted  . AR (allergic rhinitis) 08/30/2015  . Chest pain 05/13/2014  . ADD (attention deficit disorder)   . Seasonal allergies   . Acne     Past Medical History:  Diagnosis Date  . Acne   .  ADD (attention deficit disorder)   . Seasonal allergies     Past Surgical History:  Procedure Laterality Date  . ADENOIDECTOMY    . MOUTH SURGERY    . TYMPANOSTOMY TUBE PLACEMENT      Social History  Substance Use Topics  . Smoking status: Never Smoker  . Smokeless tobacco: Never Used  . Alcohol use 8.4 oz/week    14 Standard drinks or equivalent per week    Family History  Problem Relation Age of Onset  . Hypertension Father   . Hypertension Mother   . Cancer Maternal Grandmother     Melanoma  . Thyroid disease Maternal Grandmother   . Cancer Maternal Grandfather     Bladder cancer  . CAD Paternal Grandmother   . Cancer Paternal Grandmother     Melanoma  . CAD Paternal Grandfather     Died age MI 2453    No Known Allergies  Medication list has been reviewed and updated.  Current Outpatient Prescriptions on File Prior to Visit  Medication Sig Dispense Refill  . dextroamphetamine (DEXEDRINE) 10 MG 24 hr capsule Take 1 capsule (10 mg total) by mouth daily. 30 capsule 0  . ESTARYLLA 0.25-35 MG-MCG tablet  TAKE 1 TABLET BY MOUTH DAILY 84 tablet 3  . fexofenadine-pseudoephedrine (ALLEGRA-D) 60-120 MG 12 hr tablet Take 1 tablet by mouth daily.     . fluticasone (FLONASE) 50 MCG/ACT nasal spray Place 2 sprays into both nostrils daily. 16 g 12  . Loratadine (CLARITIN PO) Take by mouth daily as needed.     . Multiple Vitamin (MULTIVITAMIN) tablet Take 1 tablet by mouth daily.     No current facility-administered medications on file prior to visit.     Review of Systems:  As per HPI- otherwise negative.   Physical Examination: Vitals:   03/11/16 1042  BP: 120/76  Pulse: 84  Temp: 98.8 F (37.1 C)   Vitals:   03/11/16 1042  Weight: 116 lb 6.4 oz (52.8 kg)  Height: 5\' 1"  (1.549 m)   Body mass index is 21.99 kg/m. Ideal Body Weight: Weight in (lb) to have BMI = 25: 132  GEN: WDWN, NAD, Non-toxic, A & O x 3 HEENT: Atraumatic, Normocephalic. Neck supple. No  masses, No LAD. Ears and Nose: No external deformity. CV: RRR, No M/G/R. No JVD. No thrill. No extra heart sounds. PULM: CTA B, no wheezes, crackles, rhonchi. No retractions. No resp. distress. No accessory muscle use. EXTR: No c/c/e NEURO Normal gait.  PSYCH: Normally interactive. Conversant. Not depressed or anxious appearing.  Calm demeanor.    Assessment and Plan: ADD (attention deficit hyperactivity disorder, inattentive type) - Plan: dextroamphetamine (DEXEDRINE) 10 MG 24 hr capsule, DISCONTINUED: dextroamphetamine (DEXEDRINE) 10 MG 24 hr capsule, DISCONTINUED: dextroamphetamine (DEXEDRINE) 10 MG 24 hr capsule  Abnormal laboratory test  She will [facility]  Refilled her dexedrine for her today She will get a flu shot at work LMP 8/14 No UDS today as she has been off her medication for a couple of months Plan to meet again in 3 months/     Signed Abbe Amsterdam, MD

## 2016-03-27 ENCOUNTER — Telehealth: Payer: Self-pay | Admitting: *Deleted

## 2016-03-27 NOTE — Telephone Encounter (Signed)
Received approval letter for Dextroamphetamine Sulfate ER 10mg  from [facility].  Approved for the following time period:02/14/2016-03/15/2019.  As long as the pt remain covered by your prescription drug plan and there are no changes to your plan benefits.  Called the pharmacy with the approval, and medication has gone through and the pt has already picked up the prescription.//AB/CMA

## 2016-04-05 ENCOUNTER — Encounter: Payer: Self-pay | Admitting: Family Medicine

## 2016-04-05 DIAGNOSIS — F988 Other specified behavioral and emotional disorders with onset usually occurring in childhood and adolescence: Secondary | ICD-10-CM

## 2016-04-05 DIAGNOSIS — F900 Attention-deficit hyperactivity disorder, predominantly inattentive type: Secondary | ICD-10-CM

## 2016-04-07 ENCOUNTER — Encounter: Payer: Self-pay | Admitting: Family Medicine

## 2016-04-08 MED ORDER — DEXTROAMPHETAMINE SULFATE 10 MG PO TABS: 20.0000 mg | ORAL_TABLET | Freq: Every day | ORAL | 0 refills | Status: DC

## 2016-04-09 ENCOUNTER — Telehealth: Payer: Self-pay | Admitting: Family Medicine

## 2016-04-09 NOTE — Telephone Encounter (Signed)
Pt came in office to pick up rx for Dexedrine 20 mg, pt did give back old rx for the same meds from 08-17 (both of the rx was given back to us)

## 2016-04-10 MED ORDER — DEXTROAMPHETAMINE SULFATE ER 10 MG PO CP24: 20.0000 mg | ORAL_CAPSULE | Freq: Every day | ORAL | 0 refills | Status: DC

## 2016-04-10 NOTE — Addendum Note (Signed)
Addended by: Abbe AmsterdamOPLAND, Nessie Nong C on: 04/10/2016 08:29 AM   Modules accepted: Orders

## 2016-04-10 NOTE — Telephone Encounter (Signed)
Discussed with pt last night- apologized for error.  Did new rx for her today, printed to place up front for her Meds ordered this encounter  Medications  . DISCONTD: dextroamphetamine (DEXTROSTAT) 10 MG tablet    Sig: Take 2 tablets (20 mg total) by mouth daily.    Dispense:  60 tablet    Refill:  0  . dextroamphetamine (DEXEDRINE) 10 MG 24 hr capsule    Sig: Take 2 capsules (20 mg total) by mouth daily.    Dispense:  60 capsule    Refill:  0

## 2016-05-14 ENCOUNTER — Encounter: Payer: Self-pay | Admitting: Family Medicine

## 2016-05-14 DIAGNOSIS — F988 Other specified behavioral and emotional disorders with onset usually occurring in childhood and adolescence: Secondary | ICD-10-CM

## 2016-05-15 MED ORDER — LISDEXAMFETAMINE DIMESYLATE 30 MG PO CAPS: 30.0000 mg | ORAL_CAPSULE | Freq: Every day | ORAL | 0 refills | Status: DC

## 2016-05-15 NOTE — Telephone Encounter (Signed)
Reviewed NCCSR- no unexpected entries

## 2016-05-16 ENCOUNTER — Telehealth: Payer: Self-pay | Admitting: Emergency Medicine

## 2016-05-16 NOTE — Telephone Encounter (Signed)
Catha NottinghamBARBARA Bischof (Key: [id number]) Vyvanse 30MG  capsules Status: Question Request Created: November 2nd, 2017   Awaiting determination.   Gorden Harmsanesha Makaylynn Bonillas, CMA

## 2016-05-17 NOTE — Telephone Encounter (Addendum)
Catha NottinghamBARBARA Breisch (Key: [id number]) - [id number] Vyvanse 30MG  capsules Status: PA Response - Approved Created: November 2nd, 2017 Sent: November 3rd, 2017   Both pt and [facility] called to inform of Prior Auth approval.  Gorden Harmsanesha Chandler, CMA

## 2016-05-25 ENCOUNTER — Encounter: Payer: Self-pay | Admitting: Family Medicine

## 2016-06-10 ENCOUNTER — Ambulatory Visit (INDEPENDENT_AMBULATORY_CARE_PROVIDER_SITE_OTHER): Payer: PRIVATE HEALTH INSURANCE | Admitting: Family Medicine

## 2016-06-10 ENCOUNTER — Encounter: Payer: Self-pay | Admitting: Family Medicine

## 2016-06-10 VITALS — BP 120/72 | HR 111 | Temp 98.9°F | Ht 61.0 in | Wt 118.4 lb

## 2016-06-10 DIAGNOSIS — F988 Other specified behavioral and emotional disorders with onset usually occurring in childhood and adolescence: Secondary | ICD-10-CM | POA: Diagnosis not present

## 2016-06-10 DIAGNOSIS — Z7689 Persons encountering health services in other specified circumstances: Secondary | ICD-10-CM | POA: Diagnosis not present

## 2016-06-10 MED ORDER — LISDEXAMFETAMINE DIMESYLATE 40 MG PO CAPS: 40.0000 mg | ORAL_CAPSULE | Freq: Every day | ORAL | 0 refills | Status: DC

## 2016-06-10 NOTE — Progress Notes (Signed)
Pre visit review using our clinic review tool, if applicable. No additional management support is needed unless otherwise documented below in the visit note. 

## 2016-06-10 NOTE — Progress Notes (Signed)
[facility] [address], [address] [place], [place] [address] [phone] [phone]  Date:  06/10/2016   Name:  Ariel RanBarbara L Gutterman   DOB:  1991/01/07   MRN:  [id number]  PCP:  Abbe AmsterdamOPLAND,Geraldyn Shain, MD    Chief Complaint: Follow-up (paper work for nursing school)   History of Present Illness:  Ariel Jones is a 25 y.o. very pleasant female patient who presents with the following:  Here today for a follow-up visit.  I last saw her in August of this year to discuss her ADD; she is currently on vyvanse 30mg  for this condition.  She feels like her sx are under ok control on this dose, but notes that she was on 50 mg in the past and that this seemed to work better.  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    She notes that her sleep and appetite are ok with her current dose of vyvyanse  She needs some forms completed for her nursing program- a brief CPE, and immunization review.   Patient Active Problem List   Diagnosis Date Noted  . AR (allergic rhinitis) 08/30/2015  . Chest pain 05/13/2014  . ADD (attention deficit disorder)   . Seasonal allergies   . Acne     Past Medical History:  Diagnosis Date  . Acne   . ADD (attention deficit disorder)   . Seasonal allergies     Past Surgical History:  Procedure Laterality Date  . ADENOIDECTOMY    . MOUTH SURGERY    . TYMPANOSTOMY TUBE PLACEMENT      Social History  Substance Use Topics  . Smoking status: Never Smoker  . Smokeless tobacco: Never Used  . Alcohol use 8.4 oz/week    14 Standard drinks or equivalent per week    Family History  Problem Relation Age of Onset  . Hypertension Father   . Hypertension Mother   .  Cancer Maternal Grandmother     Melanoma  . Thyroid disease Maternal Grandmother   . Cancer Maternal Grandfather     Bladder cancer  . CAD Paternal Grandmother   . Cancer Paternal Grandmother     Melanoma  . CAD Paternal Grandfather     Died age MI 3253    No Known Allergies  Medication list has been reviewed and updated.  Current Outpatient Prescriptions on File Prior to Visit  Medication Sig Dispense Refill  . ESTARYLLA 0.25-35 MG-MCG tablet TAKE 1 TABLET BY MOUTH DAILY 84 tablet 3  . fluticasone (FLONASE) 50 MCG/ACT nasal spray Place 2 sprays into both nostrils daily. 16 g 12  . lisdexamfetamine (VYVANSE) 30 MG capsule Take 1 capsule (30 mg total) by mouth daily. 30 capsule 0  . lisdexamfetamine (VYVANSE) 30 MG capsule Take 1 capsule (30 mg total) by mouth daily. To fill 30 days after rx 30 capsule 0  . Loratadine (CLARITIN PO) Take by mouth daily as needed.     . Multiple Vitamin (MULTIVITAMIN) tablet Take 1 tablet by mouth daily.     No current facility-administered medications on file prior to visit.     Review of Systems:  As per HPI- otherwise  negative.   Physical Examination: Vitals:   06/10/16 1012  BP: 120/72  Pulse: (!) 111  Temp: 98.9 F (37.2 C)   Vitals:   06/10/16 1012  Weight: 118 lb 6 oz (53.7 kg)  Height: 5\' 1"  (1.549 m)   Body mass index is 22.37 kg/m. Ideal Body Weight: Weight in (lb) to have BMI = 25: 132  GEN: WDWN, NAD, Non-toxic, A & O x 3, looks well HEENT: Atraumatic, Normocephalic. Neck supple. No masses, No LAD.  Bilateral TM wnl, oropharynx normal.  PEERL,EOMI.   Ears and Nose: No external deformity. CV: RRR, No M/G/R. No JVD. No thrill. No extra heart sounds.  Tachycardia resolved on my exam PULM: CTA B, no wheezes, crackles, rhonchi. No retractions. No resp. distress. No accessory muscle use. ABD: S, NT, ND, +BS. No rebound. No HSM. EXTR: No c/c/e NEURO Normal gait.  PSYCH: Normally interactive. Conversant. Not depressed or  anxious appearing.  Calm demeanor.   Hearing and vision reviewed today also Assessment and Plan: Immunizations reviewed and up to date  Attention deficit disorder, unspecified hyperactivity presence - Plan: lisdexamfetamine (VYVANSE) 40 MG capsule, lisdexamfetamine (VYVANSE) 40 MG capsule  Increased her dose of vyvanse to 40 mg today UDS today She will let me know how she does on this increased dose Checked NCCSR- nothing unexpected.   Completed forms and will scan into her chart All her immunizations appear to be UTD as required by her nursing program  Signed Abbe AmsterdamJessica Mariem Skolnick, MD

## 2016-06-10 NOTE — Patient Instructions (Addendum)
We will increase your vyvanse to 40 mg; see how this works for you and keep me posted. 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  I hope that all goes smoothly for you

## 2016-06-11 ENCOUNTER — Encounter: Payer: Self-pay | Admitting: Family Medicine

## 2016-08-06 ENCOUNTER — Encounter: Payer: Self-pay | Admitting: Family Medicine

## 2016-08-06 DIAGNOSIS — F988 Other specified behavioral and emotional disorders with onset usually occurring in childhood and adolescence: Secondary | ICD-10-CM

## 2016-08-07 MED ORDER — LISDEXAMFETAMINE DIMESYLATE 40 MG PO CAPS: 40.0000 mg | ORAL_CAPSULE | Freq: Every day | ORAL | 0 refills | Status: DC

## 2016-08-07 MED ORDER — LISDEXAMFETAMINE DIMESYLATE 40 MG PO CAPS: 40.0000 mg | ORAL_CAPSULE | ORAL | 0 refills | Status: DC

## 2016-08-07 NOTE — Telephone Encounter (Signed)
UDS 05/26/16- as expected, vyvanse but no other positive Reviewed NCCSR- as expected.  Will refill for 3 months

## 2016-11-11 ENCOUNTER — Ambulatory Visit (INDEPENDENT_AMBULATORY_CARE_PROVIDER_SITE_OTHER): Payer: BLUE CROSS/BLUE SHIELD | Admitting: Family Medicine

## 2016-11-11 VITALS — BP 121/72 | HR 91 | Temp 98.0°F | Ht 61.0 in | Wt 113.8 lb

## 2016-11-11 DIAGNOSIS — L700 Acne vulgaris: Secondary | ICD-10-CM

## 2016-11-11 DIAGNOSIS — F988 Other specified behavioral and emotional disorders with onset usually occurring in childhood and adolescence: Secondary | ICD-10-CM | POA: Diagnosis not present

## 2016-11-11 MED ORDER — ADAPALENE 0.1 % EX CREA: TOPICAL_CREAM | Freq: Every day | CUTANEOUS | 0 refills | Status: DC

## 2016-11-11 MED ORDER — LISDEXAMFETAMINE DIMESYLATE 40 MG PO CAPS: 40.0000 mg | ORAL_CAPSULE | Freq: Every day | ORAL | 0 refills | Status: DC

## 2016-11-11 MED ORDER — LISDEXAMFETAMINE DIMESYLATE 40 MG PO CAPS: 40.0000 mg | ORAL_CAPSULE | ORAL | 0 refills | Status: DC

## 2016-11-11 NOTE — Patient Instructions (Signed)
It was great to see you today- take care! Please come and see me in about 6 months and best of luck with the rest of your program

## 2016-11-11 NOTE — Progress Notes (Signed)
[facility] [address], [address] [place], [place] [address] [phone]  Date:  11/11/2016   Name:  Ariel Jones   DOB:  07/25/1990   MRN:  [id number]  PCP:  Abbe Amsterdam, MD    Chief Complaint: Follow-up (Medication Refill Vyvanse)   History of Present Illness:  Ariel Jones is a 26 y.o. 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 AR (allergic rhinitis) 08/30/2015  . Chest pain 05/13/2014  . ADD (attention deficit disorder)   . Seasonal allergies   . Acne     Past Medical History:  Diagnosis Date  . Acne   . ADD (attention deficit disorder)   . Seasonal allergies     Past Surgical History:  Procedure Laterality Date  . ADENOIDECTOMY    . MOUTH SURGERY    . TYMPANOSTOMY TUBE PLACEMENT      Social History  Substance Use Topics  . Smoking status: Never Smoker  . Smokeless tobacco: Never Used  . Alcohol use 8.4 oz/week    14 Standard drinks or equivalent per week    Family History  Problem Relation Age of Onset  . Hypertension Father    . Hypertension Mother   . Cancer Maternal Grandmother     Melanoma  . Thyroid disease Maternal Grandmother   . Cancer Maternal Grandfather     Bladder cancer  . CAD Paternal Grandmother   . Cancer Paternal Grandmother     Melanoma  . CAD Paternal Grandfather     Died age MI 22    No Known Allergies  Medication list has been reviewed and updated.  Current Outpatient Prescriptions on File Prior to Visit  Medication Sig Dispense Refill  . ESTARYLLA 0.25-35 MG-MCG tablet TAKE 1 TABLET BY MOUTH DAILY 84 tablet 3  . fexofenadine (ALLEGRA) 60 MG tablet Take 60 mg by mouth daily.    Marland Kitchen lisdexamfetamine (VYVANSE) 40 MG capsule Take 1 capsule (40 mg total) by mouth daily. 30 capsule 0  . Loratadine (CLARITIN PO) Take by mouth daily as needed.     . Multiple Vitamin (MULTIVITAMIN) tablet Take 1 tablet by mouth daily.    . fluticasone (FLONASE) 50 MCG/ACT nasal spray Place 2 sprays into both nostrils daily. (Patient not taking: Reported on 11/11/2016) 16 g 12   No current facility-administered medications on file prior to visit.     Review of  Systems:  As per HPI- otherwise negative.   Physical Examination: Vitals:   11/11/16 1444  BP: 121/72  Pulse: 91  Temp: 98 F (36.7 C)   Vitals:   11/11/16 1444  Weight: 113 lb 12.8 oz (51.6 kg)  Height:  (1.549 m)   Body mass index is 21.5 kg/m. Ideal Body Weight: Weight in (lb) to have BMI = 25: 132  GEN: WDWN, NAD, Non-toxic, A & O x 3 petite build, looks well HEENT: Atraumatic, Normocephalic. Neck supple. No masses, No LAD. Ears and Nose: No external deformity. CV: RRR, No M/G/R. No JVD. No thrill. No extra heart sounds. PULM: CTA B, no wheezes, crackles, rhonchi. No retractions. No resp. distress. No accessory muscle use. EXTR: No c/c/e NEURO Normal gait.  PSYCH: Normally interactive. Conversant. Not depressed or anxious appearing.  Calm demeanor.    Assessment and Plan: Attention deficit disorder, unspecified  hyperactivity presence - Plan: lisdexamfetamine (VYVANSE) 40 MG capsule, lisdexamfetamine (VYVANSE) 40 MG capsule, lisdexamfetamine (VYVANSE) 40 MG capsule  Acne vulgaris - Plan: adapalene (DIFFERIN) 0.1 % cream  Refilled her vyvanse for 3 months.  Recheck here (and do UDS) in 6 months Refilled her differin No bothersome SE from vyvanse  Signed Abbe Amsterdam, MD

## 2016-11-11 NOTE — Progress Notes (Signed)
Pre visit review using our clinic tool,if applicable. No additional management support is needed unless otherwise documented below in the visit note.  

## 2017-01-07 ENCOUNTER — Telehealth: Payer: Self-pay

## 2017-01-07 MED ORDER — NORGESTIMATE-ETH ESTRADIOL 0.25-35 MG-MCG PO TABS: 1.0000 | ORAL_TABLET | Freq: Every day | ORAL | 0 refills | Status: DC

## 2017-01-07 MED FILL — MONO-LINYAH 28 TABLET: 0.25-35 | 84 days supply | Qty: 84 | Fill #0

## 2017-01-07 NOTE — Telephone Encounter (Signed)
Needs OC Rx called to [facility]. Has CE scheduled 02/19/17. Rx sent and patient informed.

## 2017-01-09 ENCOUNTER — Other Ambulatory Visit: Payer: Self-pay | Admitting: Emergency Medicine

## 2017-01-09 DIAGNOSIS — F988 Other specified behavioral and emotional disorders with onset usually occurring in childhood and adolescence: Secondary | ICD-10-CM

## 2017-01-09 NOTE — Telephone Encounter (Signed)
Spoke to pt who states that her insurance has changed and that a  Prior Ariel Jones may be necessary for lisdexamfetamin (VYVANSE) 40 MG capsule. Pt states that she is headed to the pharmacy right now and will have them fax the prior authorization to the office. Pt does have enough medicine to last a few more days.

## 2017-01-09 NOTE — Telephone Encounter (Signed)
Requesting: lisdexamfetamin (VYVANSE) 40 MG capsule Contract: 06/10/16 no control substance contract sign, uds sample given, Last OV: 11/11/16 Last Refill: 11/11/16  Please Advise

## 2017-01-09 NOTE — Telephone Encounter (Signed)
Tried to contact pt to see if she would be ok picking up lisdexamfetamin (VYVANSE) 40 MG capsule when provider is back in the office on Monday or if she will need it sooner. If pt needs this filled before Monday will send to Doc of the Day. No answer, pt's mailbox is full. Unable to leave message at this time.

## 2017-01-10 ENCOUNTER — Telehealth: Payer: Self-pay | Admitting: Emergency Medicine

## 2017-01-10 NOTE — Telephone Encounter (Signed)
PA started for lisdexamfetamine (VYVANSE) 40 MG capsule. Covermymeds electronically sent fax to [employer] Attn: Prior Authorization Department.   Awaiting determination.

## 2017-01-10 NOTE — Telephone Encounter (Signed)
Ariel Jones (Key: [id number]) - CMA Vyvanse 40MG  capsules Status: Sent to Plan  Created: June 29th, 2018  Sent: June 29th, 2018

## 2017-01-12 NOTE — Telephone Encounter (Signed)
Last visit with me in April She needs to do a contract- printed for her  UDS is UD NCCSR: filled vyvanse on 5/30, 4/30, 3/24, 2/23 No unexpected entries noted

## 2017-01-13 MED ORDER — LISDEXAMFETAMINE DIMESYLATE 40 MG PO CAPS: 40.0000 mg | ORAL_CAPSULE | ORAL | 0 refills | Status: DC

## 2017-01-13 MED ORDER — LISDEXAMFETAMINE DIMESYLATE 40 MG PO CAPS: 40.0000 mg | ORAL_CAPSULE | Freq: Every day | ORAL | 0 refills | Status: DC

## 2017-01-16 ENCOUNTER — Other Ambulatory Visit: Payer: Self-pay | Admitting: Emergency Medicine

## 2017-01-16 NOTE — Telephone Encounter (Signed)
Received prior authorization approval. Called [facility] to inform of approval. Pharmacy states they will contact pt.

## 2017-01-16 NOTE — Telephone Encounter (Signed)
Pt called to verify authorization was sent to pharmacy, pt informed the pharmacy is [facility]. Pt is needing authorization sent to pharmacy. Please advise.

## 2017-01-16 NOTE — Telephone Encounter (Signed)
Received PA approval from [employer] for VYVANSE 40 MG.  Authorized from 01/14/17 until 01/13/18.

## 2017-02-04 ENCOUNTER — Encounter: Payer: Self-pay | Admitting: Family Medicine

## 2017-02-17 MED FILL — VYVANSE 40 MG CAPSULE: 40 | 30 days supply | Qty: 30 | Fill #0

## 2017-02-19 ENCOUNTER — Encounter: Payer: PRIVATE HEALTH INSURANCE | Admitting: Gynecology

## 2017-02-22 IMAGING — CR DG SINUSES 1-2V
1 series · 1 of 1 positions shown · non-contrast
Comparison: None.

CLINICAL DATA: Maxillary sinus disease

EXAM:
PARANASAL SINUSES - 1-2 VIEW

[waters]
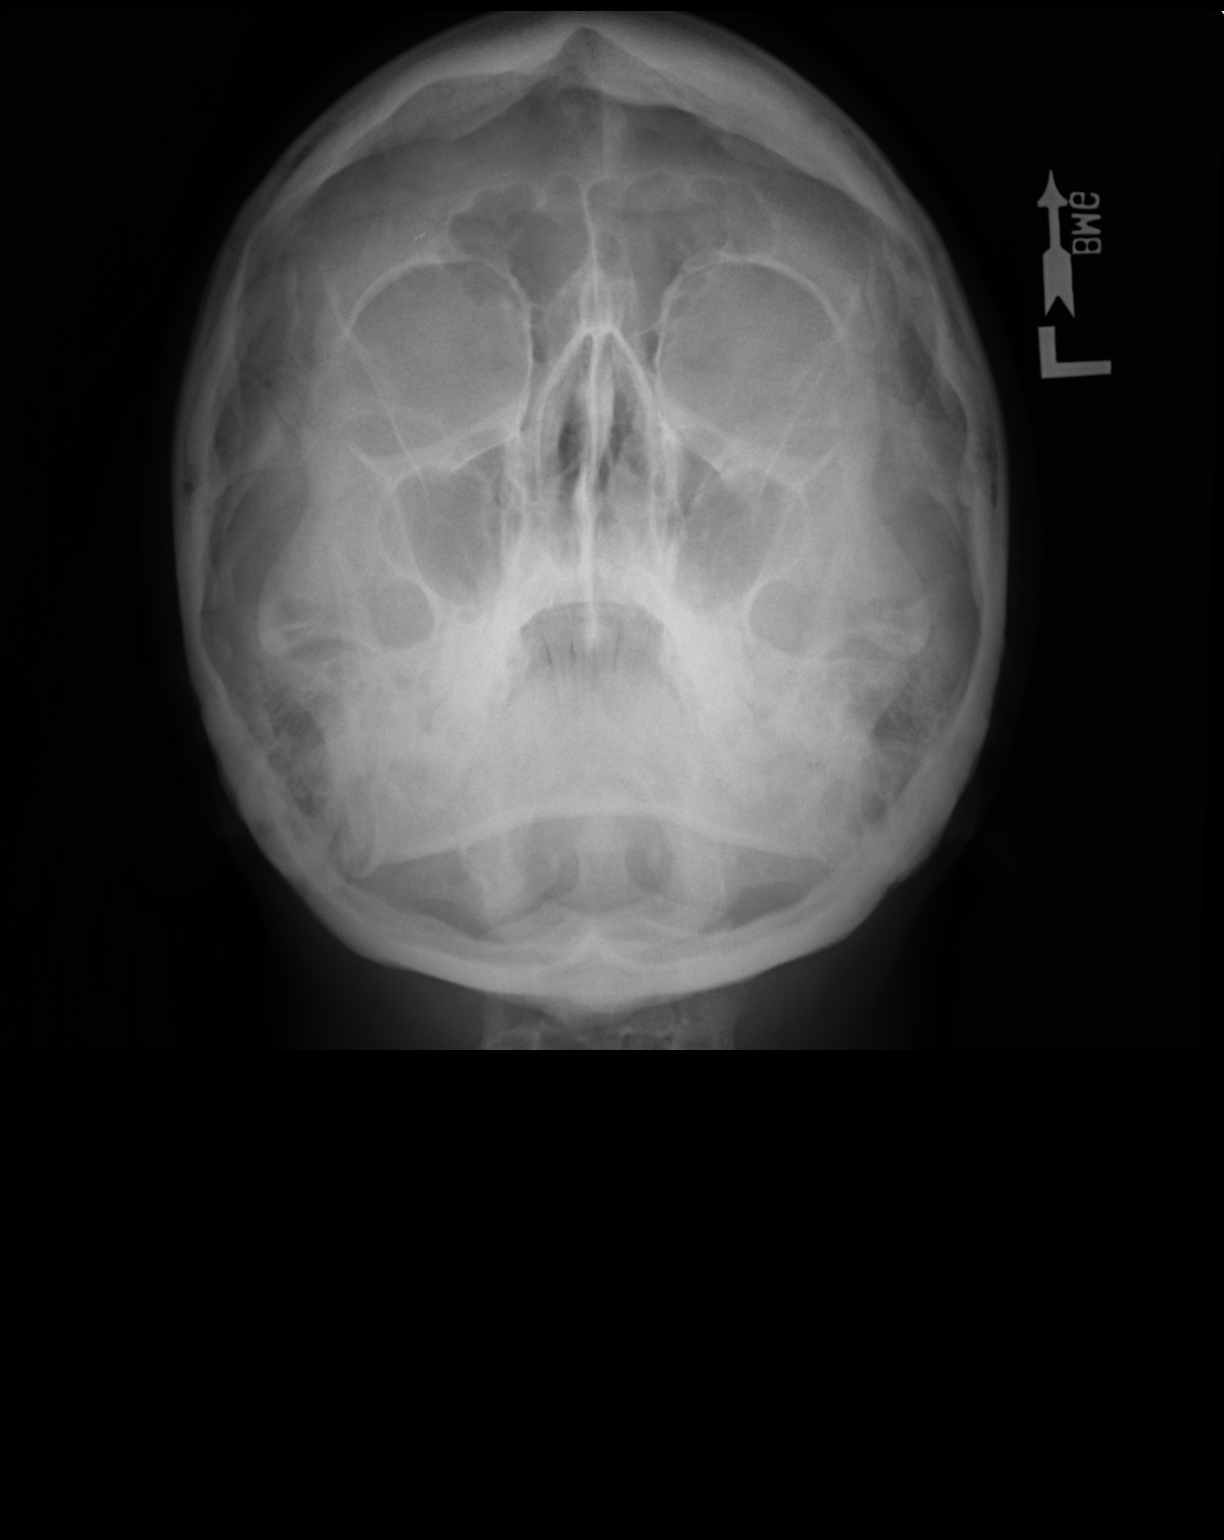

[1 of 1 positions shown; findings below may reference images not displayed]

FINDINGS: Water's view obtained. Paranasal sinuses appear clear on this single
view. No air-fluid level. No bony destruction or expansion. Mastoids
appear clear. There is minimal rightward deviation of the nasal
septum.
IMPRESSION: Paranasal sinuses clear. No air-fluid level. Minimal rightward
deviation of nasal septum.

## 2017-03-10 ENCOUNTER — Encounter: Payer: PRIVATE HEALTH INSURANCE | Admitting: Gynecology

## 2017-03-19 MED FILL — VYVANSE 40 MG CAPSULE: 40 | 30 days supply | Qty: 30 | Fill #0

## 2017-03-27 ENCOUNTER — Ambulatory Visit (INDEPENDENT_AMBULATORY_CARE_PROVIDER_SITE_OTHER): Payer: 59 | Admitting: Gynecology

## 2017-03-27 ENCOUNTER — Encounter: Payer: Self-pay | Admitting: Gynecology

## 2017-03-27 VITALS — BP 118/80 | Ht 61.0 in | Wt 112.0 lb

## 2017-03-27 DIAGNOSIS — Z01411 Encounter for gynecological examination (general) (routine) with abnormal findings: Secondary | ICD-10-CM

## 2017-03-27 DIAGNOSIS — Z13220 Encounter for screening for lipoid disorders: Secondary | ICD-10-CM | POA: Diagnosis not present

## 2017-03-27 DIAGNOSIS — R140 Abdominal distension (gaseous): Secondary | ICD-10-CM

## 2017-03-27 LAB — CBC WITH DIFFERENTIAL/PLATELET
BASOS ABS: 30 {cells}/uL (ref 0–200)
Basophils Relative: 0.5 %
EOS PCT: 0.3 %
Eosinophils Absolute: 18 cells/uL (ref 15–500)
HEMATOCRIT: 34.2 % — AB (ref 35.0–45.0)
Hemoglobin: 11.5 g/dL — ABNORMAL LOW (ref 11.7–15.5)
Lymphs Abs: 1711 cells/uL (ref 850–3900)
MCH: 30.3 pg (ref 27.0–33.0)
MCHC: 33.6 g/dL (ref 32.0–36.0)
MCV: 90 fL (ref 80.0–100.0)
MONOS PCT: 9.5 %
MPV: 9.8 fL (ref 7.5–12.5)
NEUTROS PCT: 60.7 %
Neutro Abs: 3581 cells/uL (ref 1500–7800)
PLATELETS: 236 10*3/uL (ref 140–400)
RBC: 3.8 10*6/uL (ref 3.80–5.10)
RDW: 11.8 % (ref 11.0–15.0)
TOTAL LYMPHOCYTE: 29 %
WBC mixed population: 561 cells/uL (ref 200–950)
WBC: 5.9 10*3/uL (ref 3.8–10.8)

## 2017-03-27 LAB — LIPID PANEL
Cholesterol: 154 mg/dL (ref <200)
HDL: 71 mg/dL (ref >50)
LDL Cholesterol (Calc): 66 mg/dL (calc)
NON-HDL CHOLESTEROL (CALC): 83 mg/dL (ref <130)
Total CHOL/HDL Ratio: 2.2 (calc) (ref <5.0)
Triglycerides: 90 mg/dL (ref <150)

## 2017-03-27 LAB — COMPREHENSIVE METABOLIC PANEL
AG Ratio: 1.8 (calc) (ref 1.0–2.5)
ALT: 7 U/L (ref 6–29)
AST: 12 U/L (ref 10–30)
Albumin: 4.1 g/dL (ref 3.6–5.1)
Alkaline phosphatase (APISO): 31 U/L — ABNORMAL LOW (ref 33–115)
BILIRUBIN TOTAL: 0.4 mg/dL (ref 0.2–1.2)
BUN: 7 mg/dL (ref 7–25)
CALCIUM: 8.8 mg/dL (ref 8.6–10.2)
CHLORIDE: 104 mmol/L (ref 98–110)
CO2: 24 mmol/L (ref 20–32)
Creat: 0.73 mg/dL (ref 0.50–1.10)
GLOBULIN: 2.3 g/dL (ref 1.9–3.7)
GLUCOSE: 90 mg/dL (ref 65–99)
Potassium: 3.8 mmol/L (ref 3.5–5.3)
SODIUM: 137 mmol/L (ref 135–146)
TOTAL PROTEIN: 6.4 g/dL (ref 6.1–8.1)

## 2017-03-27 MED ORDER — DROSPIRENONE-ETHINYL ESTRADIOL 3-0.02 MG PO TABS: 1.0000 | ORAL_TABLET | Freq: Every day | ORAL | 6 refills | Status: DC

## 2017-03-27 NOTE — Progress Notes (Signed)
    Ariel Jones [birth year] [id number]        26 y.o.  G0P0 for annual gynecologic exam.  Patient notes with her birth control pills after her pill free week and she restarts a new pack after a day or 2 she will have a lot of bloating and bleeding for a day or 2 and then this resolves. Reports similar symptoms when she was on Yazmin. Was switched to Loestrin 1/20 but had a lot of bloating and then started on Sprintec and did well except for this predictable bloating and bleeding one or 2 days into her new cycle pack  Past medical history,surgical history, problem list, medications, allergies, family history and social history were all reviewed and documented as reviewed in the EPIC chart.  ROS:  Performed with pertinent positives and negatives included in the history, assessment and plan.   Additional significant findings :  None   Exam: Bari MantisKim Alexis assistant Vitals:   03/27/17 1500  BP: 118/80  Weight: 112 lb (50.8 kg)  Height: 5\' 1"  (1.549 m)   Body mass index is 21.16 kg/m.  General appearance:  Normal affect, orientation and appearance. Skin: Grossly normal HEENT: Without gross lesions.  No cervical or supraclavicular adenopathy. Thyroid normal.  Lungs:  Clear without wheezing, rales or rhonchi Cardiac: RR, without RMG Abdominal:  Soft, nontender, without masses, guarding, rebound, organomegaly or hernia Breasts:  Examined lying and sitting without masses, retractions, discharge or axillary adenopathy. Pelvic:  Ext, BUS, Vagina: Normal  Cervix: Normal  Uterus: Anteverted, normal size, shape and contour, midline and mobile nontender   Adnexa: Without masses or tenderness    Anus and perineum: Normal   Rectovaginal: Normal sphincter tone without palpated masses or tenderness.    Assessment/Plan:  26 y.o. G0P0 female for annual gynecologic exam with regular menses, oral contraceptives.   1. Oral contraceptives. Patient continuing on oral contraceptives for acne suppression.  Remains virginal. Having spotting with some bloating one or 2 days into her new pack. Reviewed various options. We'll go ahead and try Yaz and discuss an every other to every third month withdrawal. Offbrand labeling. Patient will go ahead and initiate now and see how she does. Will call me if continues to be an issue. 2. Breast health. SBE monthly reviewed. 3. STD screening not done secondary virginal status 4. Gardasil series reportedly received. 5. Pap smear 2017. No Pap smear done today. We'll plan on repeat Pap smear at 3 year interval per current screening guidelines. 6. Health maintenance. Baseline CBC, CMP, lipid profile, urinalysis ordered. Follow up 1 year, sooner as needed.   Dara LordsFONTAINE,Haley Roza P MD, 3:37 PM 03/27/2017

## 2017-03-27 NOTE — Patient Instructions (Signed)
Start on the new birth control pill regiment. Call me if you have any issues with this.

## 2017-03-28 LAB — URINALYSIS W MICROSCOPIC + REFLEX CULTURE
Bacteria, UA: NONE SEEN /HPF
Bilirubin Urine: NEGATIVE
GLUCOSE, UA: NEGATIVE
HYALINE CAST: NONE SEEN /LPF
Hgb urine dipstick: NEGATIVE
Ketones, ur: NEGATIVE
Leukocyte Esterase: NEGATIVE
Nitrites, Initial: NEGATIVE
PH: 6.5 (ref 5.0–8.0)
Protein, ur: NEGATIVE
SPECIFIC GRAVITY, URINE: 1.018 (ref 1.001–1.03)

## 2017-03-28 LAB — NO CULTURE INDICATED

## 2017-04-09 ENCOUNTER — Telehealth: Payer: Self-pay | Admitting: *Deleted

## 2017-04-09 MED FILL — DROSPIR-ETH ESTRA 3/.02 MG: 3-0.02 | 84 days supply | Qty: 112 | Fill #0 | Status: TO

## 2017-04-09 NOTE — Telephone Encounter (Signed)
PA done online for Yaz approved per [employer] until 04/02/2018, [facility] informed as well.

## 2017-04-18 MED FILL — VYVANSE 40 MG CAPSULE: 40 | 30 days supply | Qty: 30 | Fill #0

## 2017-05-14 NOTE — Progress Notes (Signed)
[facility] [address] [place], [place] [address] [phone]  Date:  05/15/2017   Name:  Ariel Jones   DOB:  [birth year]   MRN:  [id number]  PCP:  Pearline Cables, MD    Chief Complaint: Follow-up (Pt here for med refill. )   History of Present Illness:  Ariel Jones is a 26 y.o. very pleasant female patient who presents with the following:  For for ADD medication visit Last seen here in April- we had recently increased her vyvanse from 30 to 35 as she was struggling to complete her graduate school work.  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   She is sleeping ok except when she is overly stressed and anxious vyvanse 40 seems to work ok but we wonder if we should decrease the dose due to her anxiety- she is ok with trying a lower dose She does feel like she has some mild depression sx, but does not wish to start mediation for this currently   Appetite is ok No SI  Wt Readings from Last 3 Encounters:  05/15/17 116 lb 3.2 oz (52.7 kg)  03/27/17 112 lb (50.8 kg)  11/11/16 113 lb 12.8 oz (51.6 kg)   Pulse Readings from Last 3 Encounters:  05/15/17 (!) 113  11/11/16 91  06/10/16 (!) 111    Patient Active Problem List   Diagnosis  Date Noted  . AR (allergic rhinitis) 08/30/2015  . Chest pain 05/13/2014  . ADD (attention deficit disorder)   . Seasonal allergies   . Acne     Past Medical History:  Diagnosis Date  . Acne   . ADD (attention deficit disorder)   . Seasonal allergies     Past Surgical History:  Procedure Laterality Date  . ADENOIDECTOMY    . MOUTH SURGERY    . TYMPANOSTOMY TUBE PLACEMENT      Social History  Substance Use Topics  . Smoking status: Never Smoker  . Smokeless tobacco: Never Used  . Alcohol use 8.4 oz/week    14 Standard drinks or equivalent per week    Family History  Problem Relation Age of Onset  . Hypertension Father   . Hypertension Mother   . Cancer Maternal Grandmother        Melanoma  . Thyroid disease Maternal Grandmother   . Cancer Maternal Grandfather        Bladder cancer  . CAD Paternal Grandmother   . Cancer Paternal Grandmother        Melanoma  . CAD Paternal Emelia Loron        Died age MI 17  No Known Allergies  Medication list has been reviewed and updated.  Current Outpatient Prescriptions on File Prior to Visit  Medication Sig Dispense Refill  . drospirenone-ethinyl estradiol (YAZ) 3-0.02 MG tablet Take 1 tablet by mouth daily. 2 Package 6  . Triamcinolone Acetonide (NASACORT AQ NA) Place into the nose as needed.    Marland Kitchen. adapalene (DIFFERIN) 0.1 % cream Apply topically at bedtime. 45 g 0  . fexofenadine (ALLEGRA) 60 MG tablet Take 60 mg by mouth daily.    Marland Kitchen. levocetirizine (XYZAL) 5 MG tablet Take 5 mg by mouth as needed for allergies.    Marland Kitchen. lisdexamfetamine (VYVANSE) 40 MG capsule Take 1 capsule (40 mg total) by mouth daily. 30 capsule 0  . Loratadine (CLARITIN PO) Take by mouth daily as needed.     . Multiple Vitamin (MULTIVITAMIN) tablet Take 1 tablet by mouth daily.     No current facility-administered medications on file prior to visit.     Review of Systems:  As per HPI- otherwise negative. No palpitations, CP or SOB She feels  physically well   Physical Examination: Vitals:   05/15/17 1556  BP: 132/86  Pulse: (!) 113  Temp: 99.5 F (37.5 C)  SpO2: 99%   Vitals:   05/15/17 1556  Weight: 116 lb 3.2 oz (52.7 kg)  Height: 5\' 1"  (1.549 m)   Body mass index is 21.96 kg/m. Ideal Body Weight: Weight in (lb) to have BMI = 25: 132  GEN: WDWN, NAD, Non-toxic, A & O x 3, slim build, looks well HEENT: Atraumatic, Normocephalic. Neck supple. No masses, No LAD. Ears and Nose: No external deformity. CV: RRR, No M/G/R. No JVD. No thrill. No extra heart sounds. PULM: CTA B, no wheezes, crackles, rhonchi. No retractions. No resp. distress. No accessory muscle use. ABD: S, NT, ND, +BS. No rebound. No HSM. EXTR: No c/c/e NEURO Normal gait.  PSYCH: Normally interactive. Conversant. Not depressed or anxious appearing.  Calm demeanor. 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  She is not sleeping as well and is anxious.  We decide to reduce her vyvanse to 30 mg in hopes that she will feel less anxious.  She will let me know how this works for her Suggested trying some melatonin for sleep- she will give this a try Gave her just one month of vyvanse today as we are trying a new dose  Signed Abbe AmsterdamJessica Karthik Whittinghill, MD

## 2017-05-15 ENCOUNTER — Ambulatory Visit (INDEPENDENT_AMBULATORY_CARE_PROVIDER_SITE_OTHER): Payer: 59 | Admitting: Family Medicine

## 2017-05-15 VITALS — BP 132/86 | HR 96 | Temp 99.5°F | Ht 61.0 in | Wt 116.2 lb

## 2017-05-15 DIAGNOSIS — F5102 Adjustment insomnia: Secondary | ICD-10-CM | POA: Diagnosis not present

## 2017-05-15 DIAGNOSIS — F988 Other specified behavioral and emotional disorders with onset usually occurring in childhood and adolescence: Secondary | ICD-10-CM

## 2017-05-15 MED ORDER — LISDEXAMFETAMINE DIMESYLATE 30 MG PO CAPS: 30.0000 mg | ORAL_CAPSULE | Freq: Every day | ORAL | 0 refills | Status: DC

## 2017-05-15 NOTE — Patient Instructions (Signed)
It was good to see you today- best of luck with the rest of your degree and hang in there!  Please let me know how you do on the 30 mg of vyvanse

## 2017-05-22 ENCOUNTER — Encounter: Payer: Self-pay | Admitting: Family Medicine

## 2017-05-22 MED FILL — VYVANSE 30 MG CAPSULE: 30 | 30 days supply | Qty: 30 | Fill #0

## 2017-06-28 ENCOUNTER — Encounter: Payer: Self-pay | Admitting: Family Medicine

## 2017-06-28 DIAGNOSIS — F988 Other specified behavioral and emotional disorders with onset usually occurring in childhood and adolescence: Secondary | ICD-10-CM

## 2017-06-30 ENCOUNTER — Other Ambulatory Visit: Payer: Self-pay | Admitting: Family Medicine

## 2017-06-30 ENCOUNTER — Other Ambulatory Visit: Payer: Self-pay | Admitting: Emergency Medicine

## 2017-06-30 DIAGNOSIS — F988 Other specified behavioral and emotional disorders with onset usually occurring in childhood and adolescence: Secondary | ICD-10-CM

## 2017-06-30 MED ORDER — LISDEXAMFETAMINE DIMESYLATE 40 MG PO CAPS: 40.0000 mg | ORAL_CAPSULE | Freq: Every day | ORAL | 0 refills | Status: DC

## 2017-06-30 MED ORDER — LISDEXAMFETAMINE DIMESYLATE 40 MG PO CAPS: 40.0000 mg | ORAL_CAPSULE | ORAL | 0 refills | Status: DC

## 2017-06-30 MED FILL — VYVANSE 40 MG CAPSULE: 40 | 30 days supply | Qty: 30 | Fill #0

## 2017-07-24 MED FILL — DROSPIR-ETH ESTRA 3/.02 MG: 3-0.02 | 90 days supply | Qty: 112 | Fill #1 | Status: TO

## 2017-07-30 MED FILL — VYVANSE 40 MG CAPSULE: 40 | 30 days supply | Qty: 30 | Fill #0

## 2017-08-30 NOTE — Progress Notes (Signed)
[facility] at [facility] [address] [place], [place] [address] [phone]  Date:  09/01/2017   Name:  Ariel Jones   DOB:  1990/07/24   MRN:  [id number]  PCP:  Pearline Cables, MD    Chief Complaint: Medication Refill (Pt here for med refill. )   History of Present Illness:  Ariel Jones is a 27 y.o. 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  She is very pleased and excited She feels like her anxiety is improved now that school is complete.  She has not pursued seeing a psychologist just yet, but she is feeling much better overall Her sleep is better Her depression sx are resolved curently  She would like to try going back to 30 of vyvanse again and see how she does with this  Her contract and UDS are UTD NCCSR: filled on 1/16 Nothing of concern noted  Patient Active Problem List   Diagnosis Date Noted  . AR (allergic rhinitis) 08/30/2015  .  Chest pain 05/13/2014  . ADD (attention deficit disorder)   . Seasonal allergies   . Acne     Past Medical History:  Diagnosis Date  . Acne   . ADD (attention deficit disorder)   . Seasonal allergies     Past Surgical History:  Procedure Laterality Date  . ADENOIDECTOMY    . MOUTH SURGERY    . TYMPANOSTOMY TUBE PLACEMENT      Social History   Tobacco Use  . Smoking status: Never Smoker  . Smokeless tobacco: Never Used  Substance Use Topics  . Alcohol use: Yes    Alcohol/week: 8.4 oz    Types: 14 Standard drinks or equivalent per week  . Drug use: No    Family History  Problem Relation Age of Onset  . Hypertension Father   . Hypertension Mother   . Cancer Maternal Grandmother        Melanoma  . Thyroid disease Maternal Grandmother   . Cancer Maternal Grandfather        Bladder cancer  . CAD Paternal Grandmother   . Cancer Paternal Grandmother        Melanoma  . CAD Paternal Grandfather        Died age MI 67    No  Known Allergies  Medication list has been reviewed and updated.  Current Outpatient Medications on File Prior to Visit  Medication Sig Dispense Refill  . adapalene (DIFFERIN) 0.1 % cream Apply topically at bedtime. 45 g 0  . drospirenone-ethinyl estradiol (YAZ) 3-0.02 MG tablet Take 1 tablet by mouth daily. 2 Package 6  . fexofenadine (ALLEGRA) 60 MG tablet Take 60 mg by mouth daily.    Marland Kitchen. levocetirizine (XYZAL) 5 MG tablet Take 5 mg by mouth as needed for allergies.    Marland Kitchen. lisdexamfetamine (VYVANSE) 40 MG capsule Take 1 capsule (40 mg total) by mouth daily. 30 capsule 0  . Loratadine (CLARITIN PO) Take by mouth daily as needed.     . Multiple Vitamin (MULTIVITAMIN) tablet Take 1 tablet by mouth daily.    . Triamcinolone Acetonide (NASACORT AQ NA) Place into the nose as needed.     No current facility-administered medications on file prior to visit.     Review of Systems:  As per HPI- otherwise negative.   Physical Examination: Vitals:    09/01/17 1204  BP: 115/80  Pulse: 90  Temp: 98.5 F (36.9 C)  SpO2: 96%   Vitals:   09/01/17 1204  Weight: 119 lb 6.4 oz (54.2 kg)  Height: 5\' 1"  (1.549 m)   Body mass index is 22.56 kg/m. Ideal Body Weight: Weight in (lb) to have BMI = 25: 132  GEN: WDWN, NAD, Non-toxic, A & O x 3, normal weight, looks well  HEENT: Atraumatic, Normocephalic. Neck supple. No masses, No LAD.  Bilateral TM wnl, oropharynx normal.  PEERL,EOMI.    Ears and Nose: No external deformity. CV: RRR, No M/G/R. No JVD. No thrill. No extra heart sounds. PULM: CTA B, no wheezes, crackles, rhonchi. No retractions. No resp. distress. No accessory muscle use. EXTR: No c/c/e NEURO Normal gait.  PSYCH: Normally interactive. Conversant. Not depressed or anxious appearing.  Calm demeanor.   Results for orders placed or performed in visit on 09/01/17  POCT rapid strep A  Result Value Ref Range   Rapid Strep A Screen Negative Negative     Assessment and Plan: Pharyngitis, unspecified etiology - Plan: POCT rapid strep A  Attention deficit disorder, unspecified hyperactivity presence - Plan: lisdexamfetamine (VYVANSE) 30 MG capsule  Negative strep today- likely viral or allergic.  She will use OTC meds as needed and let me know if not improving, Sooner if worse.  rx for vyvanse 30 mg; she will let me know if this dose is sufficient for her at this point in her life   Signed Abbe AmsterdamJessica Trinna Kunst, MD

## 2017-09-01 ENCOUNTER — Ambulatory Visit: Payer: 59 | Admitting: Family Medicine

## 2017-09-01 ENCOUNTER — Encounter: Payer: Self-pay | Admitting: Family Medicine

## 2017-09-01 ENCOUNTER — Telehealth: Payer: Self-pay | Admitting: Family Medicine

## 2017-09-01 VITALS — BP 115/80 | HR 90 | Temp 98.5°F | Ht 61.0 in | Wt 119.4 lb

## 2017-09-01 DIAGNOSIS — F988 Other specified behavioral and emotional disorders with onset usually occurring in childhood and adolescence: Secondary | ICD-10-CM | POA: Diagnosis not present

## 2017-09-01 DIAGNOSIS — J029 Acute pharyngitis, unspecified: Secondary | ICD-10-CM

## 2017-09-01 LAB — POCT RAPID STREP A (OFFICE): Rapid Strep A Screen: NEGATIVE

## 2017-09-01 MED ORDER — LISDEXAMFETAMINE DIMESYLATE 30 MG PO CAPS: 30.0000 mg | ORAL_CAPSULE | ORAL | 0 refills | Status: DC

## 2017-09-01 MED FILL — VYVANSE 30 MG CAPSULE: 30 | 30 days supply | Qty: 30 | Fill #0

## 2017-09-01 NOTE — Telephone Encounter (Signed)
Copied from CRM [id number]. Topic: Quick Communication - See Telephone Encounter >> Sep 01, 2017 12:52 PM Waymon AmatoBurton, Donna F wrote: CRM for notification. See Telephone encounter for:  [facility] is calling about vyvanse that was sent in today need to confirm the dosage and when to fill it says effective today but states to hold for 30 days  Best number [phone] pharmacy direct line 09/01/17.

## 2017-09-01 NOTE — Telephone Encounter (Signed)
Please advise 

## 2017-09-01 NOTE — Patient Instructions (Signed)
Your strep is negative- I think your sore throat is likely allergic.  However please let me know if your symptoms do not improve I changed your Vyvanse back to 30 mg; let me know if you want me to continue this strength or go back to 40 mg

## 2017-09-01 NOTE — Telephone Encounter (Signed)
Called the pharmacy to clarify- ok to fill now

## 2017-09-26 ENCOUNTER — Encounter: Payer: Self-pay | Admitting: Family Medicine

## 2017-09-26 DIAGNOSIS — F988 Other specified behavioral and emotional disorders with onset usually occurring in childhood and adolescence: Secondary | ICD-10-CM

## 2017-09-29 MED ORDER — LISDEXAMFETAMINE DIMESYLATE 40 MG PO CAPS: 40.0000 mg | ORAL_CAPSULE | Freq: Every day | ORAL | 0 refills | Status: DC

## 2017-09-29 NOTE — Addendum Note (Signed)
Addended by: Abbe AmsterdamOPLAND, JESSICA C on: 09/29/2017 04:54 PM   Modules accepted: Orders

## 2017-09-29 NOTE — Addendum Note (Signed)
Addended by: Abbe AmsterdamOPLAND, Sonal Dorwart C on: 09/29/2017 03:02 PM   Modules accepted: Orders

## 2017-09-30 MED FILL — VYVANSE 40 MG CAPSULE: 40 | 30 days supply | Qty: 30 | Fill #0

## 2017-10-31 ENCOUNTER — Encounter: Payer: Self-pay | Admitting: Family Medicine

## 2017-10-31 DIAGNOSIS — F988 Other specified behavioral and emotional disorders with onset usually occurring in childhood and adolescence: Secondary | ICD-10-CM

## 2017-11-03 ENCOUNTER — Other Ambulatory Visit: Payer: Self-pay | Admitting: Emergency Medicine

## 2017-11-03 ENCOUNTER — Telehealth: Payer: Self-pay | Admitting: Family Medicine

## 2017-11-03 DIAGNOSIS — F988 Other specified behavioral and emotional disorders with onset usually occurring in childhood and adolescence: Secondary | ICD-10-CM

## 2017-11-03 MED ORDER — LISDEXAMFETAMINE DIMESYLATE 40 MG PO CAPS: 40.0000 mg | ORAL_CAPSULE | Freq: Every day | ORAL | 0 refills | Status: DC

## 2017-11-03 MED ORDER — LISDEXAMFETAMINE DIMESYLATE 40 MG PO CAPS: 40.0000 mg | ORAL_CAPSULE | ORAL | 0 refills | Status: DC

## 2017-11-03 NOTE — Telephone Encounter (Signed)
Last seen here in February   NCCSR:  09/30/2017  2  09/29/2017  Vyvanse 40 Mg Capsule  30 30 Je Cop  [id number]  Mos (0906)  0  Comm Ins  [place]  09/01/2017  2  09/01/2017  Vyvanse 30 Mg Capsule  30 30 Je Cop  [id number]  Mos (0906)  0  Comm Ins  [place]  07/30/2017  2  06/30/2017  Vyvanse 40 Mg Capsule  30 30 Je Cop  [id number]  Mos (0906)  0      Ok to refill vyvanse 40 UDS will do at next visit

## 2017-11-03 NOTE — Telephone Encounter (Unsigned)
Copied from CRM [id number]. Topic: Quick Communication - Rx Refill/Question >> Nov 03, 2017 11:52 AM Ariel SarnaHayes, Ariel G wrote: lisdexamfetamine (VYVANSE) 40 MG capsule  Pt sent message on MyChart on Fri 19 for refill along with other questions.  Please check.  [facility] [place] - [place] [place] [id number] Phone: [phone] Fax: [phone]

## 2017-12-24 ENCOUNTER — Encounter: Payer: Self-pay | Admitting: Family Medicine

## 2017-12-24 ENCOUNTER — Ambulatory Visit (INDEPENDENT_AMBULATORY_CARE_PROVIDER_SITE_OTHER): Payer: Managed Care, Other (non HMO) | Admitting: Family Medicine

## 2017-12-24 VITALS — BP 110/62 | HR 106 | Temp 98.3°F | Resp 16 | Ht 61.0 in | Wt 122.0 lb

## 2017-12-24 DIAGNOSIS — R059 Cough, unspecified: Secondary | ICD-10-CM

## 2017-12-24 DIAGNOSIS — R05 Cough: Secondary | ICD-10-CM

## 2017-12-24 DIAGNOSIS — F988 Other specified behavioral and emotional disorders with onset usually occurring in childhood and adolescence: Secondary | ICD-10-CM | POA: Diagnosis not present

## 2017-12-24 DIAGNOSIS — J029 Acute pharyngitis, unspecified: Secondary | ICD-10-CM

## 2017-12-24 LAB — CBC
HEMATOCRIT: 36.2 % (ref 36.0–46.0)
HEMOGLOBIN: 12.2 g/dL (ref 12.0–15.0)
MCHC: 33.7 g/dL (ref 30.0–36.0)
MCV: 92 fl (ref 78.0–100.0)
PLATELETS: 230 10*3/uL (ref 150.0–400.0)
RBC: 3.94 Mil/uL (ref 3.87–5.11)
RDW: 12.9 % (ref 11.5–15.5)
WBC: 8.6 10*3/uL (ref 4.0–10.5)

## 2017-12-24 LAB — POCT RAPID STREP A (OFFICE): Rapid Strep A Screen: NEGATIVE

## 2017-12-24 MED ORDER — PENICILLIN V POTASSIUM 500 MG PO TABS: 500.0000 mg | ORAL_TABLET | Freq: Three times a day (TID) | ORAL | 0 refills | Status: DC

## 2017-12-24 MED ORDER — PREDNISONE 20 MG PO TABS: ORAL_TABLET | ORAL | 0 refills | Status: DC

## 2017-12-24 MED ORDER — LISDEXAMFETAMINE DIMESYLATE 40 MG PO CAPS: 40.0000 mg | ORAL_CAPSULE | ORAL | 0 refills | Status: DC

## 2017-12-24 MED ORDER — LISDEXAMFETAMINE DIMESYLATE 40 MG PO CAPS: 40.0000 mg | ORAL_CAPSULE | Freq: Every day | ORAL | 0 refills | Status: DC

## 2017-12-24 MED ORDER — HYDROCODONE-HOMATROPINE 5-1.5 MG/5ML PO SYRP: 5.0000 mL | ORAL_SOLUTION | Freq: Three times a day (TID) | ORAL | 0 refills | Status: AC | PRN

## 2017-12-24 NOTE — Progress Notes (Addendum)
[facility] [address], [address] [place], [place] [address] [phone]  Date:  12/24/2017   Name:  Ariel Jones   DOB:  Jun 05, 1991   MRN:  [id number]  PCP:  Pearline Cables, MD    Chief Complaint: Sore Throat (congestion, ear pressure, started few days, swollen tonsils, nausea, white spots)   History of Present Illness:  Ariel Jones is a 27 y.o. very pleasant female patient who presents with the following:  Here today for an acute visit - she has been sick for about 4 days with ST and cough She notes pressure in her ears, left ear is hurting her Her tonsils look swollen and red to her She is using some cough medication as well It hurts a lot for her to swallow- she is spitting out her saliva some of the time She did have a temp of 99.3 yesterday Some chills and aches, has felt hot and cold Some nausea but no vomiting  Also discussed her ADHD meds today- vyvanse 40 once daily  NCCSR:  Reviewed, all ok  No concerns with NCCSR- last fill 5/24 Her insurance with her new job started in May She can no longer get her rx at [facility]- will need to change to the [place] pharmacy that she has listed  [facility] and canceled her one remaining refill, will send 3 RF to new pharmacy for her   Patient Active Problem List   Diagnosis Date Noted  . AR (allergic rhinitis) 08/30/2015  . Chest pain 05/13/2014  . ADD (attention deficit disorder)   . Seasonal allergies   . Acne     Past Medical History:  Diagnosis Date  . Acne   . ADD (attention deficit disorder)   . Seasonal allergies     Past Surgical History:  Procedure Laterality Date  . ADENOIDECTOMY    . MOUTH SURGERY    . TYMPANOSTOMY TUBE PLACEMENT      Social History   Tobacco Use  . Smoking status: Never Smoker  . Smokeless tobacco: Never Used  Substance Use Topics  . Alcohol use: Yes    Alcohol/week: 8.4 oz    Types: 14 Standard drinks or  equivalent per week  . Drug use: No    Family History  Problem Relation Age of Onset  . Hypertension Father   . Hypertension Mother   . Cancer Maternal Grandmother        Melanoma  . Thyroid disease Maternal Grandmother   . Cancer Maternal Grandfather        Bladder cancer  . CAD Paternal Grandmother   . Cancer Paternal Grandmother        Melanoma  . CAD Paternal Grandfather        Died age MI 19    No Known Allergies  Medication list has been reviewed and updated.  Current Outpatient Medications on File Prior to Visit  Medication Sig Dispense Refill  . drospirenone-ethinyl estradiol (YAZ) 3-0.02 MG tablet Take 1 tablet by mouth daily. 2 Package 6  . fexofenadine (ALLEGRA) 60 MG tablet Take 60 mg by mouth daily.    Marland Kitchen levocetirizine (XYZAL) 5 MG tablet Take 5 mg by mouth as needed for allergies.    Marland Kitchen lisdexamfetamine (VYVANSE) 40 MG capsule Take 1 capsule (40 mg total) by mouth daily. 30 capsule 0  . lisdexamfetamine (VYVANSE) 40 MG capsule Take 1 capsule (40 mg total) by mouth every  morning. To fill in 30 days 30 capsule 0  . lisdexamfetamine (VYVANSE) 40 MG capsule Take 1 capsule (40 mg total) by mouth every morning. To fill in 60 days 30 capsule 0  . Loratadine (CLARITIN PO) Take by mouth daily as needed.     . Multiple Vitamin (MULTIVITAMIN) tablet Take 1 tablet by mouth daily.    . Triamcinolone Acetonide (NASACORT AQ NA) Place into the nose as needed.     No current facility-administered medications on file prior to visit.     Review of Systems:  As per HPI- otherwise negative. Some sick contacts at work but not at home No diarrhea or rash    Physical Examination: Vitals:   12/24/17 0918  BP: 110/62  Pulse: (!) 120  Resp: 16  Temp: 98.3 F (36.8 C)  SpO2: 98%   Vitals:   12/24/17 0918  Weight: 122 lb (55.3 kg)  Height: 5\' 1"  (1.549 m)   Body mass index is 23.05 kg/m. Ideal Body Weight: Weight in (lb) to have BMI = 25: 132  GEN: WDWN, NAD,  Non-toxic, A & O x 3 HEENT: Atraumatic, Normocephalic. Neck supple. No masses, No LAD.  Tonsils are large and show exudate bilaterally, but uvula is midline, no evidence of abscess PEERL, TM wnl bilaterally  Ears and Nose: No external deformity. CV: RRR, No M/G/R. No JVD. No thrill. No extra heart sounds. PULM: CTA B, no wheezes, crackles, rhonchi. No retractions. No resp. distress. No accessory muscle use. ABD: S, NT, ND EXTR: No c/c/e NEURO Normal gait.  PSYCH: Normally interactive. Conversant. Not depressed or anxious appearing.  Calm demeanor.   Results for orders placed or performed in visit on 12/24/17  POCT rapid strep A  Result Value Ref Range   Rapid Strep A Screen Negative Negative    Assessment and Plan: Sore throat - Plan: POCT rapid strep A, Culture, Group A Strep, penicillin v potassium (VEETID) 500 MG tablet  Cough - Plan: HYDROcodone-homatropine (HYCODAN) 5-1.5 MG/5ML syrup  Exudative pharyngitis - Plan: CBC, Epstein-Barr virus VCA antibody panel  Attention deficit disorder, unspecified hyperactivity presence - Plan: lisdexamfetamine (VYVANSE) 40 MG capsule, lisdexamfetamine (VYVANSE) 40 MG capsule, lisdexamfetamine (VYVANSE) 40 MG capsule  Here today with acute exudative pharyngitis  Rapid test neg, culture pending Penicillin for now, hycodan to use as needed for cough and pain, discussed sedation  Await her CBC and EBV- if CBC is normal may start her on prednisone for presumed viral illness Refilled her vyvanse for 3 months today  Signed Abbe AmsterdamJessica Trenee Igoe, MD  Received her CBC, called pt- she would like to use prednisone in hopes of getting better faster and being able to swallow more easily. Will rx pred for 6 days for her  Results for orders placed or performed in visit on 12/24/17  CBC  Result Value Ref Range   WBC 8.6 4.0 - 10.5 K/uL   RBC 3.94 3.87 - 5.11 Mil/uL   Platelets 230.0 150.0 - 400.0 K/uL   Hemoglobin 12.2 12.0 - 15.0 g/dL   HCT 16.136.2 09.636.0 -  04.546.0 %   MCV 92.0 78.0 - 100.0 fl   MCHC 33.7 30.0 - 36.0 g/dL   RDW 40.912.9 81.111.5 - 91.415.5 %  POCT rapid strep A  Result Value Ref Range   Rapid Strep A Screen Negative Negative

## 2017-12-24 NOTE — Patient Instructions (Signed)
Good to see you today but I am sorry that you are sick!   We will get a CBC and ebv titer today- depending on CBC results we can start prednisone is needed For the meantime we will use penicillin and hycodan syrup as needed I will also take care of your vyvanse rx for you  Please let me know if not feeling better in the next couple of days- Sooner if worse.

## 2017-12-24 NOTE — Addendum Note (Signed)
Addended by: Abbe AmsterdamOPLAND, Massa Pe C on: 12/24/2017 04:52 PM   Modules accepted: Orders

## 2017-12-26 ENCOUNTER — Other Ambulatory Visit: Payer: Self-pay | Admitting: Family Medicine

## 2017-12-26 LAB — CULTURE, GROUP A STREP
MICRO NUMBER:: 90704988
SPECIMEN QUALITY: ADEQUATE

## 2017-12-26 LAB — EPSTEIN-BARR VIRUS VCA ANTIBODY PANEL
EBV NA IgG: <18 U/mL
EBV VCA IgG: <18 U/mL
EBV VCA IgM: <36 U/mL

## 2017-12-26 MED ORDER — DOXYCYCLINE HYCLATE 100 MG PO CAPS: 100.0000 mg | ORAL_CAPSULE | Freq: Two times a day (BID) | ORAL | 0 refills | Status: DC

## 2018-03-25 ENCOUNTER — Encounter: Payer: Self-pay | Admitting: Family Medicine

## 2018-03-25 NOTE — Progress Notes (Signed)
NCCSR:  03/11/2018  1  12/24/2017  Vyvanse 40 Mg Capsule  30.00 30 Je Cop  [id number]  Nov (7604)  0/0  Other  [place]  02/02/2018  1  12/24/2017  Vyvanse 40 Mg Capsule  30.00 30 Je Cop  [id number]  Nov (7604)  0/0  Other  [place]  12/25/2017  1  12/24/2017  Vyvanse 40 Mg Capsule  30.00 30 Je Cop  [id number]  Nov (7604)  0/0  Other  [place]  12/24/2017  1  12/24/2017  Hydrocodone-Homatropine Syrup  75.00 5 Je Cop  [id number]  Nov (7604)  0/0 15.00 MME Other  [place]  12/05/2017  1  11/03/2017  Vyvanse 40 Mg Capsule  30.00 30 Je Cop  [id number]  G[place] (0700)  0/0      Called and LMOM for her- please check her [email or web address] re her refill

## 2018-03-26 ENCOUNTER — Encounter: Payer: Self-pay | Admitting: Family Medicine

## 2018-03-26 DIAGNOSIS — F988 Other specified behavioral and emotional disorders with onset usually occurring in childhood and adolescence: Secondary | ICD-10-CM

## 2018-03-30 MED ORDER — LISDEXAMFETAMINE DIMESYLATE 40 MG PO CAPS: 40.0000 mg | ORAL_CAPSULE | Freq: Every day | ORAL | 0 refills | Status: DC

## 2018-03-30 MED ORDER — LISDEXAMFETAMINE DIMESYLATE 40 MG PO CAPS: 40.0000 mg | ORAL_CAPSULE | ORAL | 0 refills | Status: DC

## 2018-03-30 NOTE — Telephone Encounter (Signed)
Reviewed NCCSR- all ok

## 2018-04-02 ENCOUNTER — Telehealth: Payer: Self-pay | Admitting: Family Medicine

## 2018-04-02 NOTE — Telephone Encounter (Signed)
Copied from CRM [id number]. Topic: Quick Communication - See Telephone Encounter >> Apr 02, 2018  2:03 PM Valentina LucksMatos, Jackelin wrote: CRM for notification. See Telephone encounter for: 04/02/18.   Pt's mother dropped off document to be filled out by provider [facility](Mission Engagement Team Document - 1 page) Pt's mother states pt is needing document by 04-03-2018 faxed to Attn: Rutha BouchardJane Galyean [phone]. (Mother was explain about the 5-7 business days to have form completed) Document given to Elizabeth LakeBailey.

## 2018-04-08 ENCOUNTER — Encounter: Payer: Self-pay | Admitting: Family Medicine

## 2018-04-09 NOTE — Telephone Encounter (Signed)
Pt states that [facility] and they say they have not received RX. Pt is requesting it resent if possible to new pharmacy   [facility] [place], [place] - [address] [phone] (Phone) [phone] (Fax)

## 2018-04-13 ENCOUNTER — Encounter: Payer: Self-pay | Admitting: Family Medicine

## 2018-04-13 DIAGNOSIS — F988 Other specified behavioral and emotional disorders with onset usually occurring in childhood and adolescence: Secondary | ICD-10-CM

## 2018-04-13 MED ORDER — LISDEXAMFETAMINE DIMESYLATE 40 MG PO CAPS: 40.0000 mg | ORAL_CAPSULE | Freq: Every day | ORAL | 0 refills | Status: DC

## 2018-04-13 MED ORDER — LISDEXAMFETAMINE DIMESYLATE 40 MG PO CAPS: 40.0000 mg | ORAL_CAPSULE | ORAL | 0 refills | Status: DC

## 2018-04-13 NOTE — Addendum Note (Signed)
Addended by: Abbe Amsterdam C on: 04/13/2018 11:05 AM   Modules accepted: Orders

## 2018-05-04 ENCOUNTER — Encounter: Payer: Self-pay | Admitting: Family Medicine

## 2018-05-05 ENCOUNTER — Encounter: Payer: Self-pay | Admitting: Medical

## 2018-05-05 ENCOUNTER — Other Ambulatory Visit: Payer: Self-pay

## 2018-05-05 ENCOUNTER — Ambulatory Visit: Payer: Managed Care, Other (non HMO) | Admitting: Medical

## 2018-05-05 VITALS — BP 122/70 | HR 91 | Temp 98.7°F | Resp 16 | Ht 61.0 in | Wt 121.2 lb

## 2018-05-05 DIAGNOSIS — N390 Urinary tract infection, site not specified: Secondary | ICD-10-CM | POA: Diagnosis not present

## 2018-05-05 LAB — POC URINALSYSI DIPSTICK (AUTOMATED)
BILIRUBIN UA: NEGATIVE
Blood, UA: NEGATIVE
Glucose, UA: NEGATIVE
KETONES UA: NEGATIVE
Leukocytes, UA: NEGATIVE
Nitrite, UA: NEGATIVE
PH UA: 6.5 (ref 5.0–8.0)
PROTEIN UA: NEGATIVE
SPEC GRAV UA: 1.02 (ref 1.010–1.025)
Urobilinogen, UA: NEGATIVE E.U./dL — AB

## 2018-05-05 MED ORDER — DROSPIRENONE-ETHINYL ESTRADIOL 3-0.02 MG PO TABS: 1.0000 | ORAL_TABLET | Freq: Every day | ORAL | 0 refills | Status: DC

## 2018-05-05 MED ORDER — NITROFURANTOIN MONOHYD MACRO 100 MG PO CAPS: 100.0000 mg | ORAL_CAPSULE | Freq: Two times a day (BID) | ORAL | 0 refills | Status: DC

## 2018-05-05 MED ORDER — FLUCONAZOLE 150 MG PO TABS: 150.0000 mg | ORAL_TABLET | Freq: Once | ORAL | 0 refills | Status: AC

## 2018-05-05 NOTE — Addendum Note (Signed)
Addended by: Orlene Och on: 05/05/2018 11:37 AM   Modules accepted: Orders

## 2018-05-05 NOTE — Telephone Encounter (Signed)
Spoke with pt and scheduled appt for today at 10:20am with PA, Saguier.

## 2018-05-05 NOTE — Patient Instructions (Addendum)
You appear to have a urinary tract infection. I am prescribing macrobid  antibiotic for the probable infection. Hydrate well. I am sending out a urine culture. During the interim if your signs and symptoms worsen rather than improving please notify us. We will notify your when the culture results are back.  If you do have yeast infection, I made diflucan available.  Follow up in 7 days or as needed.

## 2018-05-05 NOTE — Progress Notes (Signed)
Subjective:    Patient ID: Ariel Jones, female    DOB: Feb 11, 1991, 27 y.o.   MRN: [id number]  HPI   Pt in today reporting urinary symptoms since Sunday. Pt is heading out on mission trip to [place] Pt works as Engineer, civil (consulting) and does not urinate when should. Hold her urine a lot.    Dysuria- some but she states feel like more pain in her bladder.  Frequent urination- slight increased frequently.  Hesitancy- slight hesitant flow. Feels like needs to go then urinates smaller amoutn Suprapubic pressure-yes. Fever-no chills-no Nausea-no Vomiting-no CVA pain-no History of UTI- she gets couple of uti in college and in Acupuncturist.  On review of labs in past normal creatinine.  LMP- ended on  Oct 3,2019   Review of Systems  Constitutional: Negative for chills, fatigue and fever.  HENT: Negative for congestion, dental problem and ear pain.   Respiratory: Negative for cough, chest tightness, shortness of breath and wheezing.   Cardiovascular: Negative for chest pain and palpitations.  Gastrointestinal: Negative for abdominal pain.  Genitourinary: Positive for dysuria, frequency and urgency. Negative for decreased urine volume, vaginal discharge and vaginal pain.       Pt clarifies when she stated pelvic pain to MA is only faint twinge pain during urination. She expresses no need for pelvic or ancillary studies.  Musculoskeletal: Negative for back pain.  Neurological: Negative for dizziness and headaches.  Hematological: Negative for adenopathy. Does not bruise/bleed easily.  Psychiatric/Behavioral: Negative for behavioral problems and confusion.    Past Medical History:  Diagnosis Date  . Acne   . ADD (attention deficit disorder)   . Seasonal allergies      Social History   Socioeconomic History  . Marital status: Single    Spouse name: Not on file  . Number of children: Not on file  . Years of education: Not on file  . Highest education level: Not  on file  Occupational History  . Not on file  Social Needs  . Financial resource strain: Not on file  . Food insecurity:    Worry: Not on file    Inability: Not on file  . Transportation needs:    Medical: Not on file    Non-medical: Not on file  Tobacco Use  . Smoking status: Never Smoker  . Smokeless tobacco: Never Used  Substance and Sexual Activity  . Alcohol use: Yes    Alcohol/week: 14.0 standard drinks    Types: 14 Standard drinks or equivalent per week  . Drug use: No  . Sexual activity: Never    Birth control/protection: Pill    Comment: Virgin  Lifestyle  . Physical activity:    Days per week: Not on file    Minutes per session: Not on file  . Stress: Not on file  Relationships  . Social connections:    Talks on phone: Not on file    Gets together: Not on file    Attends religious service: Not on file    Active member of club or organization: Not on file    Attends meetings of clubs or organizations: Not on file    Relationship status: Not on file  . Intimate partner violence:    Fear of current or ex partner: Not on file    Emotionally abused: Not on file    Physically abused: Not on file    Forced sexual activity: Not on file  Other Topics Concern  . Not on file  Social History Narrative   Works on [employer]    Past Surgical History:  Procedure Laterality Date  . ADENOIDECTOMY    . MOUTH SURGERY    . TYMPANOSTOMY TUBE PLACEMENT      Family History  Problem Relation Age of Onset  . Hypertension Father   . Hypertension Mother   . Cancer Maternal Grandmother        Melanoma  . Thyroid disease Maternal Grandmother   . Cancer Maternal Grandfather        Bladder cancer  . CAD Paternal Grandmother   . Cancer Paternal Grandmother        Melanoma  . CAD Paternal Grandfather        Died age MI 63    No Known Allergies  Current Outpatient Medications on File Prior to Visit  Medication Sig Dispense Refill  . drospirenone-ethinyl estradiol (YAZ) 3-0.02  MG tablet Take 1 tablet by mouth daily. 2 Package 6  . levocetirizine (XYZAL) 5 MG tablet Take 5 mg by mouth as needed for allergies.    Marland Kitchen lisdexamfetamine (VYVANSE) 40 MG capsule Take 1 capsule (40 mg total) by mouth daily. 30 capsule 0  . lisdexamfetamine (VYVANSE) 40 MG capsule Take 1 capsule (40 mg total) by mouth every morning. To fill in 60 days 30 capsule 0  . lisdexamfetamine (VYVANSE) 40 MG capsule Take 1 capsule (40 mg total) by mouth every morning. To fill in 30 days 30 capsule 0  . Loratadine (CLARITIN PO) Take by mouth daily as needed.     . Multiple Vitamin (MULTIVITAMIN) tablet Take 1 tablet by mouth daily.    . penicillin v potassium (VEETID) 500 MG tablet Take 1 tablet (500 mg total) by mouth 3 (three) times daily. 20 tablet 0  . Triamcinolone Acetonide (NASACORT AQ NA) Place into the nose as needed.     No current facility-administered medications on file prior to visit.     BP 122/70   Pulse 91   Temp 98.7 F (37.1 C) (Oral)   Resp 16   Ht 5\' 1"  (1.549 m)   Wt 121 lb 3.2 oz (55 kg)   SpO2 100%   BMI 22.90 kg/m       Objective:   Physical Exam  General Appearance- Not in acute distress.  HEENT Eyes- Scleraeral/Conjuntiva-bilat- Not Yellow. Mouth & Throat- Normal.  Chest and Lung Exam Auscultation: Breath sounds:-Normal. Adventitious sounds:- No Adventitious sounds.  Cardiovascular Auscultation:Rythm - Regular. Heart Sounds -Normal heart sounds.  Abdomen Inspection:-Inspection Normal.  Palpation/Perucssion: Palpation and Percussion of the abdomen reveal- faint suprapubic  Tender, No Rebound tenderness, No rigidity(Guarding) and No Palpable abdominal masses.  Liver:-Normal.  Spleen:- Normal.   Back- no cva tenderness..      Assessment & Plan:  You appear to have a urinary tract infection. I am prescribing macrobid  antibiotic for the probable infection. Hydrate well. I am sending out a urine culture. During the interim if your signs and symptoms  worsen rather than improving please notify us. We will notify your when the culture results are back.  If you do have yeast infection, I made diflucan available.  Follow up in 7 days or as needed.  Esperanza Richters, PA-C

## 2018-05-06 ENCOUNTER — Encounter: Payer: Self-pay | Admitting: Medical

## 2018-05-06 LAB — URINE CULTURE
MICRO NUMBER: 91268796
Result:: NO GROWTH
SPECIMEN QUALITY:: ADEQUATE

## 2018-06-09 ENCOUNTER — Ambulatory Visit (INDEPENDENT_AMBULATORY_CARE_PROVIDER_SITE_OTHER): Payer: Managed Care, Other (non HMO) | Admitting: Gynecology

## 2018-06-09 ENCOUNTER — Encounter: Payer: Self-pay | Admitting: Gynecology

## 2018-06-09 ENCOUNTER — Encounter: Payer: 59 | Admitting: Gynecology

## 2018-06-09 VITALS — BP 118/74 | Ht 61.0 in | Wt 119.0 lb

## 2018-06-09 DIAGNOSIS — Z01419 Encounter for gynecological examination (general) (routine) without abnormal findings: Secondary | ICD-10-CM | POA: Diagnosis not present

## 2018-06-09 MED ORDER — DROSPIRENONE-ETHINYL ESTRADIOL 3-0.02 MG PO TABS: 1.0000 | ORAL_TABLET | Freq: Every day | ORAL | 6 refills | Status: DC

## 2018-06-09 NOTE — Addendum Note (Signed)
Addended by: Dayna BarkerGARDNER, Bertil Brickey K on: 06/09/2018 03:35 PM   Modules accepted: Orders

## 2018-06-09 NOTE — Progress Notes (Signed)
    Ariel RanBarbara L Jones 08/18/1990 [id number]        27 y.o.  G0P0 for annual gynecologic exam.  Doing well without gynecologic complaints  Past medical history,surgical history, problem list, medications, allergies, family history and social history were all reviewed and documented as reviewed in the EPIC chart.  ROS:  Performed with pertinent positives and negatives included in the history, assessment and plan.   Additional significant findings : None   Exam: Kennon PortelaKim Gardner assistant Vitals:   06/09/18 1501  BP: 118/74  Weight: 119 lb (54 kg)  Height: 5\' 1"  (1.549 m)   Body mass index is 22.48 kg/m.  General appearance:  Normal affect, orientation and appearance. Skin: Grossly normal HEENT: Without gross lesions.  No cervical or supraclavicular adenopathy. Thyroid normal.  Lungs:  Clear without wheezing, rales or rhonchi Cardiac: RR, without RMG Abdominal:  Soft, nontender, without masses, guarding, rebound, organomegaly or hernia Breasts:  Examined lying and sitting without masses, retractions, discharge or axillary adenopathy. Pelvic:  Ext, BUS, Vagina: Normal  Cervix: Normal.  Pap smear done  Uterus: Anteverted, normal size, shape and contour, midline and mobile nontender   Adnexa: Without masses or tenderness    Anus and perineum: Normal     Assessment/Plan:  27 y.o. G0P0 female for annual gynecologic exam with every other month withdrawal, oral contraceptives.   1. Yaz oral contraceptives.  Doing well with an every other month withdrawal.  Refill x1 year provided. 2. Breast health.  Rest exam normal today.  SBE monthly reviewed. 3. Pap smear 01/2016.  Pap smear done today.  No history of abnormal Pap smears previously. 4. STD screening not done as patient remains virginal. 5. Health maintenance.  CBC this past year was normal.  CMP last year normal.  Lipid profile excellent last year.  No lab work done today.  Follow-up 1 year, sooner as needed.   Dara Lordsimothy P Fontaine MD,  3:30 PM 06/09/2018

## 2018-06-09 NOTE — Patient Instructions (Signed)
Follow-up in 1 year for annual exam, sooner if any issues. 

## 2018-06-10 LAB — PAP IG W/ RFLX HPV ASCU
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
## 2018-07-22 ENCOUNTER — Ambulatory Visit (INDEPENDENT_AMBULATORY_CARE_PROVIDER_SITE_OTHER): Payer: Managed Care, Other (non HMO) | Admitting: Family Medicine

## 2018-07-22 ENCOUNTER — Encounter: Payer: Self-pay | Admitting: Family Medicine

## 2018-07-22 VITALS — BP 114/78 | HR 138 | Temp 98.1°F | Resp 16 | Ht 61.0 in | Wt 120.0 lb

## 2018-07-22 DIAGNOSIS — R000 Tachycardia, unspecified: Secondary | ICD-10-CM | POA: Diagnosis not present

## 2018-07-22 DIAGNOSIS — F988 Other specified behavioral and emotional disorders with onset usually occurring in childhood and adolescence: Secondary | ICD-10-CM

## 2018-07-22 DIAGNOSIS — Z5181 Encounter for therapeutic drug level monitoring: Secondary | ICD-10-CM

## 2018-07-22 LAB — TSH: TSH: 1.04 u[IU]/mL (ref 0.35–4.50)

## 2018-07-22 MED ORDER — LISDEXAMFETAMINE DIMESYLATE 30 MG PO CAPS: 30.0000 mg | ORAL_CAPSULE | Freq: Every day | ORAL | 0 refills | Status: DC

## 2018-07-22 NOTE — Patient Instructions (Addendum)
It was good to see you today- please go to the lab for your urine screening today Your heart rate is a bit high today, though it did come down.  This is likely due to your Vyvanse. Let us back down to 30 mg, let me know how this works for you  I am sorry you had a needlestick at work.  It does sound like you are in the clear, but if you wish to have blood testing at any point I am certainly glad to order it for you

## 2018-07-25 LAB — PAIN MGMT, PROFILE 8 W/CONF, U
6 Acetylmorphine: NEGATIVE ng/mL (ref <10)
ALCOHOL METABOLITES: NEGATIVE ng/mL (ref <500)
Amphetamine: 3045 ng/mL — ABNORMAL HIGH (ref <250)
Amphetamines: POSITIVE ng/mL — AB (ref <500)
BENZODIAZEPINES: NEGATIVE ng/mL (ref <100)
Buprenorphine, Urine: NEGATIVE ng/mL (ref <5)
COCAINE METABOLITE: NEGATIVE ng/mL (ref <150)
CREATININE: 123.6 mg/dL
ETHYL GLUCURONIDE (ETG): NEGATIVE ng/mL (ref <500)
ETHYL SULFATE (ETS): NEGATIVE ng/mL (ref <100)
MDMA: NEGATIVE ng/mL (ref <500)
METHAMPHETAMINE: NEGATIVE ng/mL (ref <250)
Marijuana Metabolite: NEGATIVE ng/mL (ref <20)
OPIATES: NEGATIVE ng/mL (ref <100)
OXIDANT: NEGATIVE ug/mL (ref <200)
OXYCODONE: NEGATIVE ng/mL (ref <100)
PH: 6.96 (ref 4.5–9.0)

## 2018-08-19 ENCOUNTER — Encounter: Payer: Self-pay | Admitting: Family Medicine

## 2018-08-19 DIAGNOSIS — F988 Other specified behavioral and emotional disorders with onset usually occurring in childhood and adolescence: Secondary | ICD-10-CM

## 2018-08-20 MED ORDER — LISDEXAMFETAMINE DIMESYLATE 30 MG PO CAPS: 30.0000 mg | ORAL_CAPSULE | Freq: Every day | ORAL | 0 refills | Status: DC

## 2018-12-01 ENCOUNTER — Encounter: Payer: Self-pay | Admitting: Family Medicine

## 2018-12-01 DIAGNOSIS — F988 Other specified behavioral and emotional disorders with onset usually occurring in childhood and adolescence: Secondary | ICD-10-CM

## 2018-12-01 MED ORDER — LISDEXAMFETAMINE DIMESYLATE 30 MG PO CAPS: 30.0000 mg | ORAL_CAPSULE | Freq: Every day | ORAL | 0 refills | Status: DC

## 2019-01-16 ENCOUNTER — Encounter: Payer: Self-pay | Admitting: Gynecology

## 2019-01-18 NOTE — Telephone Encounter (Signed)
I do not think that is a crazy question.  There is so much information that is coming out on a daily basis about COVID.  The problem is that a lot of the data is based on small numbers and observations which may not hold up to larger studies.  I would suspect that with any significant debilitating medical condition you would be a slightly higher risk of thrombosis being on the birth control pills.  This must be weighed against the risk of pregnancy and likewise being exposed to diseases while pregnant.  I do not know of any compelling evidence that shows patient is on birth control pills are at a significantly higher risk of thrombosis and that we should change contraception in patients at a higher risk of COVID.  Certainly if you would develop COVID then stopping the birth control pills would reverse that effect.

## 2019-03-15 ENCOUNTER — Encounter: Payer: Self-pay | Admitting: Family Medicine

## 2019-03-15 DIAGNOSIS — F988 Other specified behavioral and emotional disorders with onset usually occurring in childhood and adolescence: Secondary | ICD-10-CM

## 2019-03-15 MED ORDER — LISDEXAMFETAMINE DIMESYLATE 30 MG PO CAPS: 30.0000 mg | ORAL_CAPSULE | Freq: Every day | ORAL | 0 refills | Status: DC

## 2019-04-05 ENCOUNTER — Encounter: Payer: Self-pay | Admitting: Gynecology

## 2019-04-09 ENCOUNTER — Other Ambulatory Visit: Payer: Self-pay

## 2019-04-10 NOTE — Progress Notes (Addendum)
[facility] [address], [address] [place], [place] [address] [phone] [phone]  Date:  04/12/2019   Name:  Ariel RanBarbara L Pacini   DOB:  1990-11-11   MRN:  [id number]  PCP:  Pearline Cablesopland,  C, MD    Chief Complaint: Med check and Flu Vaccine (getting at work)   History of Present Illness:  Ariel Jones is a 28 y.o. 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 Audie BoxFontaine  She is back to working on days which is much better for her- she is happier and sleeping better  She did have a possible needlestick exposure last year- the source pt was negative but she would like a hep C and HIV level today which is certainly reasonable   She is feeling ok as far as mood and anxiety  03/16/2019  2   03/15/2019  Vyvanse 30 MG Capsule  30.00  30 Je Cop   [id number]   Wal (0728)   0   Comm Ins   [place]  02/09/2019  2   12/01/2018  Vyvanse 30 MG Capsule  30.00  30 Je Cop   [id number]   Wal [phone](0632)   0   Comm Ins   [place]  01/11/2019  2   12/01/2018  Vyvanse 30 MG Capsule  30.00  30 Je Cop   [id number]   Wal [phone])   0   Comm Ins   [place]  12/01/2018  2   12/01/2018  Vyvanse 30 MG Capsule  30.00  30 Je Cop   [id number]   Wal (0632)   0   Comm Ins   [place]  11/01/2018  2   08/20/2018  Vyvanse 30 MG Capsule  30.00  30 Je Cop   [id number]   Wal (0632)   0   Comm Ins   [place]  09/27/2018  2   08/20/2018  Vyvanse 30 MG Capsule  30.00  30 Je Cop   [id number]   Wal (0632)   0   Comm Ins   [place]  08/21/2018  2   08/20/2018  Vyvanse 30 MG Capsule  30.00  30 Je Cop    [id number]   Wal (0632)   0   Comm Ins   [place]  07/23/2018  1   07/22/2018  Vyvanse 30 MG Capsule  30.00  30 Je Cop   [id number]   Wal [phone](0632)   0   Comm Ins   [place]  06/19/2018  2   04/13/2018  Vyvanse 40 MG Capsule  30.00  30 Je Cop   [id number]   Nov (7604)   0   Other   [place]  05/21/2018  2   04/13/2018  Vyvanse 40 MG Capsule  30.00  30 Je Cop   [id number]   Nov (7604)   0   Other     04/13/2018  2   04/13/2018  Vyvanse 40 MG Capsule  30.00  30 Je Cop   [id number]   Nov (7604)   0         Patient Active Problem List   Diagnosis Date Noted  . AR (allergic rhinitis) 08/30/2015  . Chest pain 05/13/2014  . ADD (attention deficit disorder)   . Seasonal allergies   . Acne     Past Medical History:  Diagnosis Date  . Acne   . ADD (attention deficit disorder)   . Seasonal allergies     Past Surgical History:  Procedure Laterality Date  . ADENOIDECTOMY    . MOUTH SURGERY    . TYMPANOSTOMY TUBE PLACEMENT      Social History   Tobacco Use  . Smoking status: Never Smoker  . Smokeless tobacco: Never Used  Substance Use Topics  . Alcohol use: Yes    Alcohol/week: 14.0 standard drinks    Types: 14 Standard drinks or equivalent per week  . Drug use: No    Family History  Problem Relation Age of Onset  . Hypertension Father   . Hypertension Mother   . Cancer Maternal Grandmother        Melanoma  . Thyroid disease Maternal Grandmother   . Cancer Maternal Grandfather        Bladder cancer  . CAD Paternal Grandmother   . Cancer Paternal Grandmother        Melanoma  . CAD Paternal Grandfather        Died age MI 68    No Known Allergies  Medication list has been reviewed and updated.  Current Outpatient Medications on File Prior to Visit  Medication Sig Dispense Refill  . cetirizine (ZYRTEC) 10 MG tablet Take 10 mg by mouth daily.    . drospirenone-ethinyl estradiol (YAZ) 3-0.02 MG tablet Take 1 tablet by mouth daily. 2 Package 6  . lisdexamfetamine (VYVANSE) 30 MG capsule Take 1 capsule (30 mg  total) by mouth daily. To fill in 30 days 30 capsule 0  . lisdexamfetamine (VYVANSE) 30 MG capsule Take 1 capsule (30 mg total) by mouth daily. To fill in 60 days 30 capsule 0  . lisdexamfetamine (VYVANSE) 30 MG capsule Take 1 capsule (30 mg total) by mouth daily. 30 capsule 0  . Multiple Vitamin (MULTIVITAMIN) tablet Take 1 tablet by mouth daily.    . Triamcinolone Acetonide (NASACORT AQ NA) Place into the nose as needed.     No current facility-administered medications on file prior to visit.     Review of Systems:  As per HPI- otherwise negative. No fever or chills No CP or SOB  Tends to have tachycardia   Physical Examination: Vitals:   04/12/19 1358  BP: 124/86  Resp: 16  Temp: 98.1 F (36.7 C)   Vitals:   04/12/19 1358  Weight: 128 lb (58.1 kg)  Height: 5\' 1"  (1.549 m)   Body mass index is 24.19 kg/m. Ideal Body Weight: Weight in (lb) to have BMI = 25: 132  GEN: WDWN, NAD, Non-toxic, A & O x 3, normal weight, looks well  HEENT: Atraumatic, Normocephalic. Neck supple. No masses, No LAD. Ears and Nose: No external deformity. CV: RRR, No M/G/R. No JVD. No thrill. No extra heart sounds. PULM: CTA B, no wheezes, crackles, rhonchi. No retractions. No resp. distress. No accessory muscle use. ABD: S, NT, ND. No rebound. No HSM. EXTR: No c/c/e NEURO Normal gait.  PSYCH: Normally interactive. Conversant. Not depressed or anxious appearing.  Calm demeanor.   Counted pulse at 100 BPM today  Pulse Readings from Last 3 Encounters:  07/22/18 (!) 138  05/05/18 91  12/24/17 (!) 106    Assessment and Plan: Attention deficit disorder, unspecified hyperactivity presence - Plan: lisdexamfetamine (VYVANSE) 30 MG capsule, lisdexamfetamine (VYVANSE) 30 MG capsule, lisdexamfetamine (VYVANSE) 30 MG capsule  Medication monitoring encounter  Screening for HIV (human immunodeficiency virus) - Plan: HIV Antibody (routine testing w rflx)  Encounter for hepatitis C screening test for  low risk patient - Plan: Hepatitis C antibody  Screening for diabetes mellitus - Plan: Comprehensive metabolic panel, Hemoglobin A1c  Screening for deficiency anemia - Plan: CBC  Screening for thyroid disorder - Plan: TSH  Screening for hyperlipidemia - Plan: Lipid panel  Refilled vyvanse for 3 months Routine labs pending today Will plan further follow- up pending labs. Visit in 6 months   Signed Lamar Blinks, MD  Received her labs, 9/29.  Message to patient  Results for orders placed or performed in visit on 04/12/19  CBC  Result Value Ref Range   WBC 6.6 4.0 - 10.5 K/uL   RBC 4.08 3.87 - 5.11 Mil/uL   Platelets 286.0 150.0 - 400.0 K/uL   Hemoglobin 12.5 12.0 - 15.0 g/dL   HCT 37.7 36.0 - 46.0 %   MCV 92.6 78.0 - 100.0 fl   MCHC 33.2 30.0 - 36.0 g/dL   RDW 12.8 11.5 - 15.5 %  Comprehensive metabolic panel  Result Value Ref Range   Sodium 140 135 - 145 mEq/L   Potassium 4.1 3.5 - 5.1 mEq/L   Chloride 103 96 - 112 mEq/L   CO2 26 19 - 32 mEq/L   Glucose, Bld 78 70 - 99 mg/dL   BUN 12 6 - 23 mg/dL   Creatinine, Ser 0.74 0.40 - 1.20 mg/dL   Total Bilirubin 0.4 0.2 - 1.2 mg/dL   Alkaline Phosphatase 38 (L) 39 - 117 U/L   AST 14 0 - 37 U/L   ALT 9 0 - 35 U/L   Total Protein 7.2 6.0 - 8.3 g/dL   Albumin 4.5 3.5 - 5.2 g/dL   Calcium 9.8 8.4 - 10.5 mg/dL   GFR 93.22 >60.00 mL/min  Hemoglobin A1c  Result Value Ref Range   Hgb A1c MFr Bld 5.0 4.6 - 6.5 %  Lipid panel  Result Value Ref Range   Cholesterol 203 (H) 0 - 200 mg/dL   Triglycerides 88.0 0.0 - 149.0 mg/dL   HDL 89.90 >39.00 mg/dL   VLDL 17.6 0.0 - 40.0 mg/dL   LDL Cholesterol 95 0 - 99 mg/dL   Total CHOL/HDL Ratio 2    NonHDL 112.68   TSH  Result Value Ref Range   TSH 1.67 0.35 - 4.50 uIU/mL

## 2019-04-10 NOTE — Patient Instructions (Signed)
It was great to see you again today, be safe I will be in touch with your labs Please see me in 6 months

## 2019-04-12 ENCOUNTER — Other Ambulatory Visit: Payer: Self-pay

## 2019-04-12 ENCOUNTER — Ambulatory Visit: Payer: Managed Care, Other (non HMO) | Admitting: Family Medicine

## 2019-04-12 ENCOUNTER — Encounter: Payer: Self-pay | Admitting: Family Medicine

## 2019-04-12 VITALS — BP 124/86 | HR 107 | Temp 98.1°F | Resp 16 | Ht 61.0 in | Wt 128.0 lb

## 2019-04-12 DIAGNOSIS — Z114 Encounter for screening for human immunodeficiency virus [HIV]: Secondary | ICD-10-CM

## 2019-04-12 DIAGNOSIS — Z130 Encounter for screening for diseases of the blood and blood-forming organs and certain disorders involving the immune mechanism: Secondary | ICD-10-CM | POA: Diagnosis not present

## 2019-04-12 DIAGNOSIS — F988 Other specified behavioral and emotional disorders with onset usually occurring in childhood and adolescence: Secondary | ICD-10-CM | POA: Diagnosis not present

## 2019-04-12 DIAGNOSIS — Z1159 Encounter for screening for other viral diseases: Secondary | ICD-10-CM

## 2019-04-12 DIAGNOSIS — Z13220 Encounter for screening for lipoid disorders: Secondary | ICD-10-CM | POA: Diagnosis not present

## 2019-04-12 DIAGNOSIS — Z131 Encounter for screening for diabetes mellitus: Secondary | ICD-10-CM | POA: Diagnosis not present

## 2019-04-12 DIAGNOSIS — Z5181 Encounter for therapeutic drug level monitoring: Secondary | ICD-10-CM | POA: Diagnosis not present

## 2019-04-12 DIAGNOSIS — Z1329 Encounter for screening for other suspected endocrine disorder: Secondary | ICD-10-CM

## 2019-04-12 LAB — COMPREHENSIVE METABOLIC PANEL
ALT: 9 U/L (ref 0–35)
AST: 14 U/L (ref 0–37)
Albumin: 4.5 g/dL (ref 3.5–5.2)
Alkaline Phosphatase: 38 U/L — ABNORMAL LOW (ref 39–117)
BUN: 12 mg/dL (ref 6–23)
CO2: 26 mEq/L (ref 19–32)
Calcium: 9.8 mg/dL (ref 8.4–10.5)
Chloride: 103 mEq/L (ref 96–112)
Creatinine, Ser: 0.74 mg/dL (ref 0.40–1.20)
GFR: 93.22 mL/min (ref >60.00)
Glucose, Bld: 78 mg/dL (ref 70–99)
Potassium: 4.1 mEq/L (ref 3.5–5.1)
Sodium: 140 mEq/L (ref 135–145)
Total Bilirubin: 0.4 mg/dL (ref 0.2–1.2)
Total Protein: 7.2 g/dL (ref 6.0–8.3)

## 2019-04-12 LAB — CBC
HCT: 37.7 % (ref 36.0–46.0)
Hemoglobin: 12.5 g/dL (ref 12.0–15.0)
MCHC: 33.2 g/dL (ref 30.0–36.0)
MCV: 92.6 fl (ref 78.0–100.0)
Platelets: 286 10*3/uL (ref 150.0–400.0)
RBC: 4.08 Mil/uL (ref 3.87–5.11)
RDW: 12.8 % (ref 11.5–15.5)
WBC: 6.6 10*3/uL (ref 4.0–10.5)

## 2019-04-12 LAB — TSH: TSH: 1.67 u[IU]/mL (ref 0.35–4.50)

## 2019-04-12 LAB — HEMOGLOBIN A1C: Hgb A1c MFr Bld: 5 % (ref 4.6–6.5)

## 2019-04-12 LAB — LIPID PANEL
Cholesterol: 203 mg/dL — ABNORMAL HIGH (ref 0–200)
HDL: 89.9 mg/dL (ref >39.00)
LDL Cholesterol: 95 mg/dL (ref 0–99)
NonHDL: 112.68
Total CHOL/HDL Ratio: 2
Triglycerides: 88 mg/dL (ref 0.0–149.0)
VLDL: 17.6 mg/dL (ref 0.0–40.0)

## 2019-04-12 MED ORDER — LISDEXAMFETAMINE DIMESYLATE 30 MG PO CAPS: 30.0000 mg | ORAL_CAPSULE | Freq: Every day | ORAL | 0 refills | Status: DC

## 2019-04-13 ENCOUNTER — Encounter: Payer: Self-pay | Admitting: Family Medicine

## 2019-04-13 LAB — HEPATITIS C ANTIBODY
Hepatitis C Ab: NONREACTIVE
SIGNAL TO CUT-OFF: 0.02 (ref <1.00)

## 2019-04-13 LAB — HIV ANTIBODY (ROUTINE TESTING W REFLEX): HIV 1&2 Ab, 4th Generation: NONREACTIVE

## 2019-06-14 ENCOUNTER — Other Ambulatory Visit: Payer: Self-pay

## 2019-06-15 ENCOUNTER — Encounter: Payer: Self-pay | Admitting: Gynecology

## 2019-06-15 ENCOUNTER — Ambulatory Visit (INDEPENDENT_AMBULATORY_CARE_PROVIDER_SITE_OTHER): Payer: Managed Care, Other (non HMO) | Admitting: Gynecology

## 2019-06-15 ENCOUNTER — Other Ambulatory Visit: Payer: Self-pay

## 2019-06-15 VITALS — BP 116/74 | Ht 61.0 in | Wt 123.0 lb

## 2019-06-15 DIAGNOSIS — Z01419 Encounter for gynecological examination (general) (routine) without abnormal findings: Secondary | ICD-10-CM

## 2019-06-15 MED ORDER — DROSPIRENONE-ETHINYL ESTRADIOL 3-0.02 MG PO TABS: 1.0000 | ORAL_TABLET | Freq: Every day | ORAL | 6 refills | Status: DC

## 2019-06-15 NOTE — Progress Notes (Signed)
    Ariel Jones 11-20-1990 [id number]        28 y.o.  G0P0 for annual gynecologic exam.  Without gynecologic complaints  Past medical history,surgical history, problem list, medications, allergies, family history and social history were all reviewed and documented as reviewed in the EPIC chart.  ROS:  Performed with pertinent positives and negatives included in the history, assessment and plan.   Additional significant findings : None   Exam: Caryn Bee assistant Vitals:   06/15/19 1356  BP: 116/74  Weight: 123 lb (55.8 kg)  Height: 5\' 1"  (1.549 m)   Body mass index is 23.24 kg/m.  General appearance:  Normal affect, orientation and appearance. Skin: Grossly normal HEENT: Without gross lesions.  No cervical or supraclavicular adenopathy. Thyroid normal.  Lungs:  Clear without wheezing, rales or rhonchi Cardiac: RR, without RMG Abdominal:  Soft, nontender, without masses, guarding, rebound, organomegaly or hernia Breasts:  Examined lying and sitting without masses, retractions, discharge or axillary adenopathy. Pelvic:  Ext, BUS, Vagina: Normal  Cervix: Normal  Uterus: Anteverted, normal size, shape and contour, midline and mobile nontender   Adnexa: Without masses or tenderness    Anus and perineum: Normal    Assessment/Plan:  28 y.o. G0P0 female for annual gynecologic exam.  With regular menses every other month withdrawal, oral contraceptives  1. Yaz oral contraceptives.  Doing well with every other month withdrawal.  Refill x1 year provided. 2. Breast health.  Breast exam normal today.  SBE monthly reviewed. 3. Pap smear 2019.  No Pap smear done today.  No history of abnormal Pap smears.  Plan repeat Pap smear at 3-year interval per current screening guidelines. 4. STD screening not indicated as patient remains virginal. 5. Health maintenance.  Recently had routine blood work.  Follow-up 1 year, sooner as needed.   Anastasio Auerbach MD, 2:20 PM 06/15/2019

## 2019-06-15 NOTE — Patient Instructions (Signed)
Follow-up in 1 year for annual exam, sooner if any issues. 

## 2019-07-13 ENCOUNTER — Encounter: Payer: Self-pay | Admitting: Family Medicine

## 2019-07-13 DIAGNOSIS — F988 Other specified behavioral and emotional disorders with onset usually occurring in childhood and adolescence: Secondary | ICD-10-CM

## 2019-07-13 MED ORDER — LISDEXAMFETAMINE DIMESYLATE 30 MG PO CAPS: 30.0000 mg | ORAL_CAPSULE | Freq: Every day | ORAL | 0 refills | Status: DC

## 2019-07-13 NOTE — Telephone Encounter (Signed)
Requesting:Vyvance requesting 3 month suplly Contract:none needs csc UDS:07/22/2018 Last Visit:04/12/2019 Next Visit:none scheduled Last Refill:04/12/2019  Please Advise

## 2019-08-06 ENCOUNTER — Other Ambulatory Visit: Payer: Self-pay

## 2019-08-06 MED ORDER — DROSPIRENONE-ETHINYL ESTRADIOL 3-0.02 MG PO TABS: 1.0000 | ORAL_TABLET | Freq: Every day | ORAL | 6 refills | Status: DC

## 2019-10-17 NOTE — Progress Notes (Signed)
[facility] [address], [address] [place], [place] [address] [phone]  Date:  10/18/2019   Name:  Ariel Jones   DOB:  27-Jun-1991   MRN:  [id number]  PCP:  Pearline Cables, MD    Chief Complaint: Medication Refill (vyvanse)   History of Present Illness:  Ariel Jones is a 29 y.o. very pleasant female patient who presents with the following:  Generally healthy young woman here today for a follow-up visit I treat her attention deficit disorder with Vyvanse 30 mg She also suffers from allergic rhinitis She is thinking of getting allergy shots -will be done per her allergist She is sleeping ok, appetite is normal She does work days only, her shifts can be variable but she is glad not to work nights  Last seen by myself in September Her gynecologist is Dr. 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 [employer] She had routine labs in September  Wt Readings from Last 3 Encounters:  10/18/19 126 lb (57.2 kg)  06/15/19 123 lb (55.8 kg)  04/12/19 128 lb (58.1 kg)     09/14/2019  1   07/13/2019  Vyvanse 30 MG Capsule  30.00  30 Je Cop   [id number]   Wal (2191)   0   Comm Ins   [place]  08/13/2019  1   07/13/2019  Vyvanse 30 MG Capsule  30.00  30 Je Cop   [id number]   Wal (2191)   0   Comm Ins   [place]  07/13/2019  1   07/13/2019  Vyvanse 30 MG Capsule  30.00  30 Je Cop   [id number]   Wal (2191)   0   Comm Ins   [place]  06/09/2019  1   04/12/2019  Vyvanse 30 MG Capsule  30.00  30 Je Cop   [id number]   Wal (2191)   0   Comm Ins   [place]  05/11/2019  1   04/12/2019  Vyvanse 30 MG Capsule  30.00  30 Je Cop   [id number]   Wal (2191)   0   Comm Ins   [place]  04/12/2019  1   04/12/2019  Vyvanse 30 MG Capsule  30.00  30 Je Cop   [id number]   Wal (2191)   0   Comm Ins   [place]  03/16/2019  1   03/15/2019  Vyvanse 30 MG Capsule  30.00  30 Je Cop   [id number]   Wal (0728)   0   Comm Ins   [place]  02/09/2019  1   12/01/2018  Vyvanse 30 MG Capsule   30.00  30 Je Cop   [id number]   Wal (0632)   0   Comm Ins   [place]  01/11/2019  1   12/01/2018  Vyvanse 30 MG Capsule  30.00  30 Je Cop   [id number]   Wal (0632)   0   Comm Ins   [place]  12/01/2018  1   12/01/2018  Vyvanse 30 MG Capsule  30.00  30 Je Cop   [id number]   Wal (0632)   0   Comm Ins     11/01/2018  1   08/20/2018  Vyvanse 30 MG Capsule  30.00  30 Je Cop   [id number]   Wal [phone])   0         Patient Active Problem List  Diagnosis Date Noted  . AR (allergic rhinitis) 08/30/2015  . Chest pain 05/13/2014  . ADD (attention deficit disorder)   . Seasonal allergies   . Acne     Past Medical History:  Diagnosis Date  . Acne   . ADD (attention deficit disorder)   . Seasonal allergies     Past Surgical History:  Procedure Laterality Date  . ADENOIDECTOMY    . MOUTH SURGERY    . TYMPANOSTOMY TUBE PLACEMENT      Social History   Tobacco Use  . Smoking status: Never Smoker  . Smokeless tobacco: Never Used  Substance Use Topics  . Alcohol use: Yes    Alcohol/week: 14.0 standard drinks    Types: 14 Standard drinks or equivalent per week  . Drug use: No    Family History  Problem Relation Age of Onset  . Hypertension Father   . Hypertension Mother   . Cancer Maternal Grandmother        Melanoma  . Thyroid disease Maternal Grandmother   . Cancer Maternal Grandfather        Bladder cancer  . CAD Paternal Grandmother   . Cancer Paternal Grandmother        Melanoma  . CAD Paternal 65        Died age MI 47    No Known Allergies  Medication list has been reviewed and updated.  Current Outpatient Medications on File Prior to Visit  Medication Sig Dispense Refill  . drospirenone-ethinyl estradiol (YAZ) 3-0.02 MG tablet Take 1 tablet by mouth daily. 56 tablet 6  . Fexofenadine-Pseudoephedrine (ALLEGRA-D 12 HOUR PO) Take by mouth.    . lisdexamfetamine (VYVANSE) 30 MG capsule Take 1 capsule (30 mg total) by mouth daily. To fill in 30 days 30 capsule 0  . lisdexamfetamine  (VYVANSE) 30 MG capsule Take 1 capsule (30 mg total) by mouth daily. To fill in 60 days 30 capsule 0  . lisdexamfetamine (VYVANSE) 30 MG capsule Take 1 capsule (30 mg total) by mouth daily. 30 capsule 0  . Multiple Vitamin (MULTIVITAMIN) tablet Take 1 tablet by mouth daily.    . Triamcinolone Acetonide (NASACORT AQ NA) Place into the nose as needed.     No current facility-administered medications on file prior to visit.    Review of Systems:  As per HPI- otherwise negative.   Physical Examination: Vitals:   10/18/19 1024  BP: 111/80  Pulse: (!) 106  Resp: 16  Temp: (!) 97.4 F (36.3 C)  SpO2: 98%   Vitals:   10/18/19 1024  Weight: 126 lb (57.2 kg)  Height: 5\' 1"  (1.549 m)   Body mass index is 23.81 kg/m. Ideal Body Weight: Weight in (lb) to have BMI = 25: 132  GEN: no acute distress.  Normal weight, looks well HEENT: Atraumatic, Normocephalic.  Ears and Nose: No external deformity. CV: RRR, No M/G/R. No JVD. No thrill. No extra heart sounds. PULM: CTA B, no wheezes, crackles, rhonchi. No retractions. No resp. distress. No accessory muscle use. EXTR: No c/c/e PSYCH: Normally interactive. Conversant.    Assessment and Plan: Attention deficit disorder, unspecified hyperactivity presence - Plan: lisdexamfetamine (VYVANSE) 30 MG capsule, lisdexamfetamine (VYVANSE) 30 MG capsule, lisdexamfetamine (VYVANSE) 30 MG capsule  Medication monitoring encounter  Here today to follow-up on ADHD medication.  She is doing well on her current dose of Vyvanse, she is sleeping and eating normally.  She feels that her symptoms are adequately controlled She has an appointment with GYN in  December Asked her to see me in 6 months for next visit  This visit occurred during the SARS-CoV-2 public health emergency.  Safety protocols were in place, including screening questions prior to the visit, additional usage of staff PPE, and extensive cleaning of exam room while observing appropriate  contact time as indicated for disinfecting solutions.    Signed Abbe Amsterdam, MD

## 2019-10-17 NOTE — Patient Instructions (Addendum)
Great to see you again today, as always!    Please see me in about 6 months Be safe!

## 2019-10-18 ENCOUNTER — Encounter: Payer: Self-pay | Admitting: Family Medicine

## 2019-10-18 ENCOUNTER — Other Ambulatory Visit: Payer: Self-pay

## 2019-10-18 ENCOUNTER — Ambulatory Visit (INDEPENDENT_AMBULATORY_CARE_PROVIDER_SITE_OTHER): Payer: Managed Care, Other (non HMO) | Admitting: Family Medicine

## 2019-10-18 VITALS — BP 111/80 | HR 96 | Temp 97.4°F | Resp 16 | Ht 61.0 in | Wt 126.0 lb

## 2019-10-18 DIAGNOSIS — Z5181 Encounter for therapeutic drug level monitoring: Secondary | ICD-10-CM

## 2019-10-18 DIAGNOSIS — F988 Other specified behavioral and emotional disorders with onset usually occurring in childhood and adolescence: Secondary | ICD-10-CM

## 2019-10-18 MED ORDER — LISDEXAMFETAMINE DIMESYLATE 30 MG PO CAPS: 30.0000 mg | ORAL_CAPSULE | Freq: Every day | ORAL | 0 refills | Status: DC

## 2020-01-13 ENCOUNTER — Encounter: Payer: Self-pay | Admitting: Family Medicine

## 2020-01-13 DIAGNOSIS — F988 Other specified behavioral and emotional disorders with onset usually occurring in childhood and adolescence: Secondary | ICD-10-CM

## 2020-01-13 MED ORDER — LISDEXAMFETAMINE DIMESYLATE 30 MG PO CAPS: 30.0000 mg | ORAL_CAPSULE | Freq: Every day | ORAL | 0 refills | Status: DC

## 2020-04-08 NOTE — Progress Notes (Addendum)
[facility] [address], [address] [place], [place] [address] [phone]  Date:  04/10/2020   Name:  Ariel Jones   DOB:  1990/08/26   MRN:  [id number]  PCP:  Pearline Cables, MD    Chief Complaint: Med check   History of Present Illness:  Ariel Jones is a 29 y.o. 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 Booster will be given at her job  She is doing immunotherapy for her mold and other plant allergies- sublingual drops  03/18/2020  1   01/13/2020  Vyvanse 30 MG Capsule  30.00  30 Je Cop   [id number]   Wal (2191)   0/0   Comm Ins   [place]  02/18/2020  1   01/13/2020  Vyvanse 30 MG Capsule  30.00  30 Je Cop   [id number]   Wal (2191)   0/0   Comm Ins   [place]  01/13/2020  1   01/13/2020  Vyvanse 30 MG Capsule  30.00  30 Je Cop   [id number]   Wal (2191)   0/0   Comm Ins   [place]  12/18/2019  1   10/18/2019  Vyvanse 30 MG Capsule  30.00  30 Je Cop   [id number]   Wal (2191)   0/0   Comm Ins   [place]  11/16/2019  1   10/18/2019  Vyvanse 30 MG Capsule  30.00  30 Je Cop   [id number]   Wal (2191)   0/0   Comm Ins   [place]  10/18/2019  1   10/18/2019  Vyvanse 30 MG Capsule  30.00  30 Je Cop   [id number]   Wal (2191)   0/0   Comm Ins   [place]  09/14/2019  1   07/13/2019  Vyvanse 30 MG Capsule  30.00  30 Je Cop    [id number]   Wal (2191)   0/0   Comm Ins   [place]  08/13/2019  1   07/13/2019  Vyvanse 30 MG Capsule  30.00  30 Je Cop   [id number]   Wal (2191)   0/0   Comm Ins   [place]  07/13/2019  1   07/13/2019  Vyvanse 30 MG Capsule  30.00  30 Je Cop   [id number]   Wal (2191)   0/0        Patient Active Problem List   Diagnosis Date Noted  . AR (allergic rhinitis) 08/30/2015  . Chest pain 05/13/2014  . ADD (attention deficit disorder)   . Seasonal allergies   . Acne     Past Medical History:  Diagnosis Date  . Acne   . ADD (attention deficit disorder)   . Seasonal allergies  Past Surgical History:  Procedure Laterality Date  . ADENOIDECTOMY    . MOUTH SURGERY    . TYMPANOSTOMY TUBE PLACEMENT      Social History   Tobacco Use  . Smoking status: Never Smoker  . Smokeless tobacco: Never Used  Vaping Use  . Vaping Use: Never used  Substance Use Topics  . Alcohol use: Yes    Alcohol/week: 14.0 standard drinks    Types: 14 Standard drinks or equivalent per week  . Drug use: No    Family History  Problem Relation Age of Onset  . Hypertension Father   . Hypertension Mother   . Cancer Maternal Grandmother        Melanoma  . Thyroid disease Maternal Grandmother   . Cancer Maternal Grandfather        Bladder cancer  . CAD Paternal Grandmother   . Cancer Paternal Grandmother        Melanoma  . CAD Paternal Grandfather        Died age MI 11    No Known Allergies  Medication list has been reviewed and updated.  Current Outpatient Medications on File Prior to Visit  Medication Sig Dispense Refill  . drospirenone-ethinyl estradiol (YAZ) 3-0.02 MG tablet Take 1 tablet by mouth daily. 56 tablet 6  . lisdexamfetamine (VYVANSE) 30 MG capsule Take 1 capsule (30 mg total) by mouth daily. To fill in 30 days 30 capsule 0  . lisdexamfetamine (VYVANSE) 30 MG capsule Take 1 capsule (30 mg total) by mouth daily. To fill in 60 days 30 capsule 0  . lisdexamfetamine (VYVANSE) 30 MG capsule Take 1 capsule  (30 mg total) by mouth daily. 30 capsule 0  . Loratadine (CLARITIN) 10 MG CAPS Take by mouth.    . montelukast (SINGULAIR) 10 MG tablet Take 10 mg by mouth at bedtime.    . Multiple Vitamin (MULTIVITAMIN) tablet Take 1 tablet by mouth daily.    . Triamcinolone Acetonide (NASACORT AQ NA) Place into the nose daily.     Marland Kitchen UNABLE TO FIND Place under the tongue daily. Med Name: Immunotherapy Allergy Drop     No current facility-administered medications on file prior to visit.    Review of Systems:  As per HPI- otherwise negative.   Physical Examination: Vitals:   04/10/20 1410  BP: 112/70  Pulse: (!) 107  Resp: 16  SpO2: 99%   Vitals:   04/10/20 1410  Weight: 129 lb (58.5 kg)  Height: 5\' 1"  (1.549 m)   Body mass index is 24.37 kg/m. Ideal Body Weight: Weight in (lb) to have BMI = 25: 132  GEN: no acute distress.  Looks well, normal weight HEENT: Atraumatic, Normocephalic.  Ears and Nose: No external deformity. CV: RRR, No M/G/R. No JVD. No thrill. No extra heart sounds. PULM: CTA B, no wheezes, crackles, rhonchi. No retractions. No resp. distress. No accessory muscle use. ABD: S, NT, ND, +BS. No rebound. No HSM. EXTR: No c/c/e PSYCH: Normally interactive. Conversant.   Pulse Readings from Last 3 Encounters:  04/10/20 (!) 107  10/18/19 96  04/12/19 (!) 107      Assessment and Plan: Attention deficit disorder, unspecified hyperactivity presence - Plan: lisdexamfetamine (VYVANSE) 30 MG capsule, lisdexamfetamine (VYVANSE) 30 MG capsule, lisdexamfetamine (VYVANSE) 30 MG capsule  Screening for diabetes mellitus - Plan: Comprehensive metabolic panel, Hemoglobin A1c  Screening for deficiency anemia - Plan: CBC  Screening for thyroid disorder - Plan: TSH  Screening for hyperlipidemia - Plan: Lipid panel  Very nice young woman here today for a follow-up visit.  Refilled her Vyvanse for the next 3 months, routine labs pending as above We discussed her plans for COVID-19  booster and flu vaccination Plan to visit in about 6 months This visit occurred during the SARS-CoV-2 public health emergency.  Safety protocols were in place, including screening questions prior to the visit, additional usage of staff PPE, and extensive cleaning of exam room while observing appropriate contact time as indicated for disinfecting solutions.    Signed Abbe Amsterdam, MD   Received her labs as below, 9/28 Message to patient  Results for orders placed or performed in visit on 04/10/20  CBC  Result Value Ref Range   WBC 7.2 3.8 - 10.8 Thousand/uL   RBC 4.38 3.80 - 5.10 Million/uL   Hemoglobin 13.2 11.7 - 15.5 g/dL   HCT 94.4 35 - 45 %   MCV 92.0 80.0 - 100.0 fL   MCH 30.1 27.0 - 33.0 pg   MCHC 32.8 32.0 - 36.0 g/dL   RDW 96.7 59.1 - 63.8 %   Platelets 291 140 - 400 Thousand/uL   MPV 9.6 7.5 - 12.5 fL  Comprehensive metabolic panel  Result Value Ref Range   Glucose, Bld 83 65 - 99 mg/dL   BUN 11 7 - 25 mg/dL   Creat 4.66 5.99 - 3.57 mg/dL   BUN/Creatinine Ratio NOT APPLICABLE 6 - 22 (calc)   Sodium 140 135 - 146 mmol/L   Potassium 4.6 3.5 - 5.3 mmol/L   Chloride 103 98 - 110 mmol/L   CO2 24 20 - 32 mmol/L   Calcium 9.6 8.6 - 10.2 mg/dL   Total Protein 6.8 6.1 - 8.1 g/dL   Albumin 4.5 3.6 - 5.1 g/dL   Globulin 2.3 1.9 - 3.7 g/dL (calc)   AG Ratio 2.0 1.0 - 2.5 (calc)   Total Bilirubin 0.4 0.2 - 1.2 mg/dL   Alkaline phosphatase (APISO) 33 31 - 125 U/L   AST 14 10 - 30 U/L   ALT 7 6 - 29 U/L  Hemoglobin A1c  Result Value Ref Range   Hgb A1c MFr Bld 4.9 <5.7 % of total Hgb   Mean Plasma Glucose 94 (calc)   eAG (mmol/L) 5.2 (calc)  Lipid panel  Result Value Ref Range   Cholesterol 177 <200 mg/dL   HDL 83 > OR = 50 mg/dL   Triglycerides 57 <017 mg/dL   LDL Cholesterol (Calc) 81 mg/dL (calc)   Total CHOL/HDL Ratio 2.1 <5.0 (calc)   Non-HDL Cholesterol (Calc) 94 <793 mg/dL (calc)  TSH  Result Value Ref Range   TSH 0.82 mIU/L

## 2020-04-08 NOTE — Patient Instructions (Signed)
It was great to see you again today, as always. Please see me in about 6 months

## 2020-04-10 ENCOUNTER — Ambulatory Visit: Payer: Managed Care, Other (non HMO) | Admitting: Family Medicine

## 2020-04-10 ENCOUNTER — Encounter: Payer: Self-pay | Admitting: Family Medicine

## 2020-04-10 ENCOUNTER — Other Ambulatory Visit: Payer: Self-pay

## 2020-04-10 VITALS — BP 112/70 | HR 107 | Resp 16 | Ht 61.0 in | Wt 129.0 lb

## 2020-04-10 DIAGNOSIS — Z130 Encounter for screening for diseases of the blood and blood-forming organs and certain disorders involving the immune mechanism: Secondary | ICD-10-CM | POA: Diagnosis not present

## 2020-04-10 DIAGNOSIS — Z13220 Encounter for screening for lipoid disorders: Secondary | ICD-10-CM

## 2020-04-10 DIAGNOSIS — Z1329 Encounter for screening for other suspected endocrine disorder: Secondary | ICD-10-CM | POA: Diagnosis not present

## 2020-04-10 DIAGNOSIS — Z131 Encounter for screening for diabetes mellitus: Secondary | ICD-10-CM | POA: Diagnosis not present

## 2020-04-10 DIAGNOSIS — F988 Other specified behavioral and emotional disorders with onset usually occurring in childhood and adolescence: Secondary | ICD-10-CM | POA: Diagnosis not present

## 2020-04-10 MED ORDER — LISDEXAMFETAMINE DIMESYLATE 30 MG PO CAPS: 30.0000 mg | ORAL_CAPSULE | Freq: Every day | ORAL | 0 refills | Status: DC

## 2020-04-11 ENCOUNTER — Encounter: Payer: Self-pay | Admitting: Family Medicine

## 2020-04-11 LAB — COMPREHENSIVE METABOLIC PANEL
AG Ratio: 2 (calc) (ref 1.0–2.5)
ALT: 7 U/L (ref 6–29)
AST: 14 U/L (ref 10–30)
Albumin: 4.5 g/dL (ref 3.6–5.1)
Alkaline phosphatase (APISO): 33 U/L (ref 31–125)
BUN: 11 mg/dL (ref 7–25)
CO2: 24 mmol/L (ref 20–32)
Calcium: 9.6 mg/dL (ref 8.6–10.2)
Chloride: 103 mmol/L (ref 98–110)
Creat: 0.78 mg/dL (ref 0.50–1.10)
Globulin: 2.3 g/dL (calc) (ref 1.9–3.7)
Glucose, Bld: 83 mg/dL (ref 65–99)
Potassium: 4.6 mmol/L (ref 3.5–5.3)
Sodium: 140 mmol/L (ref 135–146)
Total Bilirubin: 0.4 mg/dL (ref 0.2–1.2)
Total Protein: 6.8 g/dL (ref 6.1–8.1)

## 2020-04-11 LAB — CBC
HCT: 40.3 % (ref 35.0–45.0)
Hemoglobin: 13.2 g/dL (ref 11.7–15.5)
MCH: 30.1 pg (ref 27.0–33.0)
MCHC: 32.8 g/dL (ref 32.0–36.0)
MCV: 92 fL (ref 80.0–100.0)
MPV: 9.6 fL (ref 7.5–12.5)
Platelets: 291 10*3/uL (ref 140–400)
RBC: 4.38 10*6/uL (ref 3.80–5.10)
RDW: 11.9 % (ref 11.0–15.0)
WBC: 7.2 10*3/uL (ref 3.8–10.8)

## 2020-04-11 LAB — LIPID PANEL
Cholesterol: 177 mg/dL (ref <200)
HDL: 83 mg/dL (ref >=50)
LDL Cholesterol (Calc): 81 mg/dL (calc)
Non-HDL Cholesterol (Calc): 94 mg/dL (calc) (ref <130)
Total CHOL/HDL Ratio: 2.1 (calc) (ref <5.0)
Triglycerides: 57 mg/dL (ref <150)

## 2020-04-11 LAB — HEMOGLOBIN A1C
Hgb A1c MFr Bld: 4.9 % of total Hgb (ref <5.7)
Mean Plasma Glucose: 94 (calc)
eAG (mmol/L): 5.2 (calc)

## 2020-04-11 LAB — TSH: TSH: 0.82 mIU/L

## 2020-06-19 ENCOUNTER — Encounter: Payer: Managed Care, Other (non HMO) | Admitting: Nurse Practitioner

## 2020-07-04 ENCOUNTER — Other Ambulatory Visit: Payer: Self-pay

## 2020-07-04 ENCOUNTER — Encounter: Payer: Self-pay | Admitting: Nurse Practitioner

## 2020-07-04 ENCOUNTER — Ambulatory Visit (INDEPENDENT_AMBULATORY_CARE_PROVIDER_SITE_OTHER): Payer: Managed Care, Other (non HMO) | Admitting: Nurse Practitioner

## 2020-07-04 VITALS — BP 115/76 | Ht 61.0 in | Wt 126.0 lb

## 2020-07-04 DIAGNOSIS — Z3041 Encounter for surveillance of contraceptive pills: Secondary | ICD-10-CM | POA: Diagnosis not present

## 2020-07-04 DIAGNOSIS — Z01419 Encounter for gynecological examination (general) (routine) without abnormal findings: Secondary | ICD-10-CM

## 2020-07-04 MED ORDER — DROSPIRENONE-ETHINYL ESTRADIOL 3-0.02 MG PO TABS: 1.0000 | ORAL_TABLET | Freq: Every day | ORAL | 6 refills | Status: DC

## 2020-07-04 NOTE — Patient Instructions (Signed)
Health Maintenance, Female Adopting a healthy lifestyle and getting preventive care are important in promoting health and wellness. Ask your health care provider about:  The right schedule for you to have regular tests and exams.  Things you can do on your own to prevent diseases and keep yourself healthy. What should I know about diet, weight, and exercise? Eat a healthy diet   Eat a diet that includes plenty of vegetables, fruits, low-fat dairy products, and lean protein.  Do not eat a lot of foods that are high in solid fats, added sugars, or sodium. Maintain a healthy weight Body mass index (BMI) is used to identify weight problems. It estimates body fat based on height and weight. Your health care provider can help determine your BMI and help you achieve or maintain a healthy weight. Get regular exercise Get regular exercise. This is one of the most important things you can do for your health. Most adults should:  Exercise for at least 150 minutes each week. The exercise should increase your heart rate and make you sweat (moderate-intensity exercise).  Do strengthening exercises at least twice a week. This is in addition to the moderate-intensity exercise.  Spend less time sitting. Even light physical activity can be beneficial. Watch cholesterol and blood lipids Have your blood tested for lipids and cholesterol at 29 years of age, then have this test every 5 years. Have your cholesterol levels checked more often if:  Your lipid or cholesterol levels are high.  You are older than 29 years of age.  You are at high risk for heart disease. What should I know about cancer screening? Depending on your health history and family history, you may need to have cancer screening at various ages. This may include screening for:  Breast cancer.  Cervical cancer.  Colorectal cancer.  Skin cancer.  Lung cancer. What should I know about heart disease, diabetes, and high blood  pressure? Blood pressure and heart disease  High blood pressure causes heart disease and increases the risk of stroke. This is more likely to develop in people who have high blood pressure readings, are of African descent, or are overweight.  Have your blood pressure checked: ? Every 3-5 years if you are 18-39 years of age. ? Every year if you are 40 years old or older. Diabetes Have regular diabetes screenings. This checks your fasting blood sugar level. Have the screening done:  Once every three years after age 40 if you are at a normal weight and have a low risk for diabetes.  More often and at a younger age if you are overweight or have a high risk for diabetes. What should I know about preventing infection? Hepatitis B If you have a higher risk for hepatitis B, you should be screened for this virus. Talk with your health care provider to find out if you are at risk for hepatitis B infection. Hepatitis C Testing is recommended for:  Everyone born from 1945 through 1965.  Anyone with known risk factors for hepatitis C. Sexually transmitted infections (STIs)  Get screened for STIs, including gonorrhea and chlamydia, if: ? You are sexually active and are younger than 29 years of age. ? You are older than 29 years of age and your health care provider tells you that you are at risk for this type of infection. ? Your sexual activity has changed since you were last screened, and you are at increased risk for chlamydia or gonorrhea. Ask your health care provider if   you are at risk.  Ask your health care provider about whether you are at high risk for HIV. Your health care provider may recommend a prescription medicine to help prevent HIV infection. If you choose to take medicine to prevent HIV, you should first get tested for HIV. You should then be tested every 3 months for as long as you are taking the medicine. Pregnancy  If you are about to stop having your period (premenopausal) and  you may become pregnant, seek counseling before you get pregnant.  Take 400 to 800 micrograms (mcg) of folic acid every day if you become pregnant.  Ask for birth control (contraception) if you want to prevent pregnancy. Osteoporosis and menopause Osteoporosis is a disease in which the bones lose minerals and strength with aging. This can result in bone fractures. If you are 65 years old or older, or if you are at risk for osteoporosis and fractures, ask your health care provider if you should:  Be screened for bone loss.  Take a calcium or vitamin D supplement to lower your risk of fractures.  Be given hormone replacement therapy (HRT) to treat symptoms of menopause. Follow these instructions at home: Lifestyle  Do not use any products that contain nicotine or tobacco, such as cigarettes, e-cigarettes, and chewing tobacco. If you need help quitting, ask your health care provider.  Do not use street drugs.  Do not share needles.  Ask your health care provider for help if you need support or information about quitting drugs. Alcohol use  Do not drink alcohol if: ? Your health care provider tells you not to drink. ? You are pregnant, may be pregnant, or are planning to become pregnant.  If you drink alcohol: ? Limit how much you use to 0-1 drink a day. ? Limit intake if you are breastfeeding.  Be aware of how much alcohol is in your drink. In the [place] one drink equals one 12 oz bottle of beer (355 mL), one 5 oz glass of wine (148 mL), or one 1 oz glass of hard liquor (44 mL). General instructions  Schedule regular health, dental, and eye exams.  Stay current with your vaccines.  Tell your health care provider if: ? You often feel depressed. ? You have ever been abused or do not feel safe at home. Summary  Adopting a healthy lifestyle and getting preventive care are important in promoting health and wellness.  Follow your health care provider's instructions about healthy  diet, exercising, and getting tested or screened for diseases.  Follow your health care provider's instructions on monitoring your cholesterol and blood pressure. This information is not intended to replace advice given to you by your health care provider. Make sure you discuss any questions you have with your health care provider. Document Revised: 06/24/2018 Document Reviewed: 06/24/2018 Elsevier Patient Education  2020 Elsevier Inc.  

## 2020-07-04 NOTE — Progress Notes (Signed)
   Ariel Jones Mar 14, 29 1992 [id number]   History:  29 y.o. G0 presents for annual exam. She had a sore spot on right axillary recently but this has resolved. She did not feel a lump. Father being evaluated for breast lump. Monthly cycle on OCPs continuously with every other month withdrawal. Virgin. Gardasil series completed.   Gynecologic History Patient's last menstrual period was 06/07/2020. Period Duration (Days): 4 DAYS Period Pattern: Regular Menstrual Flow: Moderate Menstrual Control: Tampon Menstrual Control Change Freq (Hours): EVERY 4 HOURS Dysmenorrhea: (!) Mild Dysmenorrhea Symptoms: Headache Contraception: OCP (estrogen/progesterone) Last Pap: 06/09/2018. Results were: Normal  Past medical history, past surgical history, family history and social history were all reviewed and documented in the EPIC chart. 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   ROS:  A ROS was performed and pertinent positives and negatives are included.  Exam:  Vitals:   07/04/20 1029  BP: 115/76  Weight: 126 lb (57.2 kg)  Height: 5\' 1"  (1.549 m)   Body mass index is 23.81 kg/m.  General appearance:  Normal Thyroid:  Symmetrical, normal in size, without palpable masses or nodularity. Respiratory  Auscultation:  Clear without wheezing or rhonchi Cardiovascular  Auscultation:  Regular rate, without rubs, murmurs or gallops  Edema/varicosities:  Not grossly evident Abdominal  Soft,nontender, without masses, guarding or rebound.  Liver/spleen:  No organomegaly noted  Hernia:  None appreciated  Skin  Inspection:  Grossly normal   Breasts: Examined lying and sitting.   Right: Without masses, retractions, discharge or axillary adenopathy.   Left: Without masses, retractions, discharge or axillary adenopathy. Gentitourinary   Inguinal/mons:  Normal without inguinal adenopathy  External genitalia:  Normal  BUS/Urethra/Skene's glands:  Normal  Vagina:  Normal  Cervix:  Normal  Uterus:  Normal in size,  shape and contour.  Midline and mobile  Adnexa/parametria:     Rt: Without masses or tenderness.   Lt: Without masses or tenderness.  Anus and perineum: Normal  Assessment/Plan:  29 y.o. G0 for annual exam.   Well female exam with routine gynecological exam - Education provided on SBEs, importance of preventative screenings, current guidelines, high calcium diet, regular exercise, and multivitamin daily. Labs with PCP.   Encounter for surveillance of contraceptive pills - Plan: drospirenone-ethinyl estradiol (YAZ) 3-0.02 MG tablet daily. Taking as prescribed. Stopped recently to see how acne did without it but it returned so she restarted. Refill x 1 year provided.   Screening for cervical cancer - Normal pap history. Will repeat at 3-year interval per guidelines.  Follow up in 1 year for annual.      08-01-1996 [facility], 10:37 AM 07/04/2020'

## 2020-07-12 ENCOUNTER — Encounter: Payer: Self-pay | Admitting: Family Medicine

## 2020-07-12 DIAGNOSIS — F988 Other specified behavioral and emotional disorders with onset usually occurring in childhood and adolescence: Secondary | ICD-10-CM

## 2020-07-12 MED ORDER — LISDEXAMFETAMINE DIMESYLATE 30 MG PO CAPS: 30.0000 mg | ORAL_CAPSULE | Freq: Every day | ORAL | 0 refills | Status: DC

## 2020-07-12 NOTE — Telephone Encounter (Signed)
Requesting: Vyvanse 30mg  Contract: None UDS: None Last Visit: 04/10/2020 Next Visit: None Last Refill: 04/10/2020 #30 and 0RF (x3)  Please Advise

## 2020-09-06 ENCOUNTER — Encounter: Payer: Self-pay | Admitting: Family Medicine

## 2020-09-09 NOTE — Progress Notes (Addendum)
[facility] at [facility] [address], [address] [place], [place] [address] [phone]  Date:  09/11/2020   Name:  Ariel Jones   DOB:  17-Nov-1990   MRN:  [id number]  PCP:  Pearline Cables, MD    Chief Complaint: Medication Management (ADD-discuss medication) and Lab Work (Vitamin d check/)   History of Present Illness:  Ariel Jones is a 30 y.o. very pleasant female patient who presents with the following:  Here today for a follow-up visit  History of ADD, she is an Charity fundraiser  She recently contacted me with the following concern:  I have been seeing a therapist with [address] [facility]. During our session today she recommended that I discuss medication for depression with you. She also thinks it would be a good idea to check my vitamin D level. I have one more prescription left on my vyvanse before I need to come see you but I would rather come sooner than later if you need to see me about the matter above. Please let me know what you think. Labs 03/2020  Pt notes that she is working with her therapist and they are becoming more certain that she is suffering from depression.  She also thought that she was just anxious, but now depression seems more likely.  She notes that she often feels unmotivated to do things She often notes difficulty sleeping and poor appetite She may feel bad about herself, may feel "in a fog" Looking back she feels like this may have been going on for 10 yeas or so She feels like anxiety may related to past trauma which has perhaps lead to her current depression She is not suffering from dysthymia No SI   She feels like her vyvanse is working well for her in controlling her ADD symptoms  tsh checked in September   08/17/2020  07/12/2020   1  Vyvanse 30 Mg Capsule  30.00  30  Je Cop  [id number]  Wal ([id number])  0/0   Comm Ins  [place]    07/16/2020  07/12/2020   1  Vyvanse 30 Mg Capsule  30.00  30  Je Cop   [id number]  Wal (2191)  0/0   Comm Ins  [place]    06/17/2020  04/10/2020   1  Vyvanse 30 Mg Capsule  30.00  30  Je Cop  [id number]  Wal (2191)  0/0   Comm Ins  [place]    05/19/2020  04/10/2020   1  Vyvanse 30 Mg Capsule  30.00  30  Je Cop  [id number]  Wal (2191)  0/0   Comm Ins  [place]    04/19/2020  04/10/2020   1  Vyvanse 30 Mg Capsule  30.00  30  Je Cop  [id number]  Wal (2191)  0/0   Comm Ins  [place]    03/18/2020  01/13/2020   1  Vyvanse 30 Mg Capsule  30.00  30  Je Cop  [id number]  Wal (2191)  0/0   Comm Ins  [place]    02/18/2020  01/13/2020   1  Vyvanse 30 Mg Capsule  30.00  30  Je Cop  [id number]  Wal (2191)  0/0   Comm Ins      01/13/2020  01/13/2020   1  Vyvanse 30 Mg Capsule  30.00  30  Je Cop  [id number]  Wal (2191)          Patient Active  Problem List   Diagnosis Date Noted  . AR (allergic rhinitis) 08/30/2015  . Chest pain 05/13/2014  . ADD (attention deficit disorder)   . Seasonal allergies   . Acne     Past Medical History:  Diagnosis Date  . Acne   . ADD (attention deficit disorder)   . Seasonal allergies     Past Surgical History:  Procedure Laterality Date  . ADENOIDECTOMY    . MOUTH SURGERY    . TYMPANOSTOMY TUBE PLACEMENT      Social History   Tobacco Use  . Smoking status: Never Smoker  . Smokeless tobacco: Never Used  Vaping Use  . Vaping Use: Never used  Substance Use Topics  . Alcohol use: Yes    Comment: 2 DRINKS PER DAY  . Drug use: No    Family History  Problem Relation Age of Onset  . Hypertension Father   . Hypertension Mother   . Cancer Maternal Grandmother        Melanoma  . Thyroid disease Maternal Grandmother   . Cancer Maternal Grandfather        Bladder cancer  . CAD Paternal Grandmother   . Cancer Paternal Grandmother        Melanoma  . CAD Paternal Grandfather        Died age MI 11    No Known Allergies  Medication list has been reviewed and updated.  Current Outpatient Medications on File Prior to Visit  Medication Sig Dispense Refill  .  Azelastine HCl 0.15 % SOLN SMARTSIG:2 Spray(s) Both Nares Twice Daily PRN    . drospirenone-ethinyl estradiol (YAZ) 3-0.02 MG tablet Take 1 tablet by mouth daily. 56 tablet 6  . EPINEPHrine 0.3 mg/0.3 mL IJ SOAJ injection See admin instructions.    . fexofenadine (ALLEGRA) 60 MG tablet Take 60 mg by mouth 2 (two) times daily.    Marland Kitchen lisdexamfetamine (VYVANSE) 30 MG capsule Take 1 capsule (30 mg total) by mouth daily. To fill in 30 days 30 capsule 0  . lisdexamfetamine (VYVANSE) 30 MG capsule Take 1 capsule (30 mg total) by mouth daily. To fill in 60 days 30 capsule 0  . lisdexamfetamine (VYVANSE) 30 MG capsule Take 1 capsule (30 mg total) by mouth daily. 30 capsule 0  . Loratadine 10 MG CAPS Take by mouth.    . montelukast (SINGULAIR) 10 MG tablet Take 10 mg by mouth at bedtime.    . Multiple Vitamin (MULTIVITAMIN) tablet Take 1 tablet by mouth daily.    . Triamcinolone Acetonide (NASACORT AQ NA) Place into the nose daily.     Marland Kitchen UNABLE TO FIND Place under the tongue daily. Med Name: Immunotherapy Allergy Drop     No current facility-administered medications on file prior to visit.    Review of Systems:  As per HPI- otherwise negative.   Physical Examination: Vitals:   09/11/20 1348  BP: 110/72  Pulse: 100  Resp: 15  Temp: 98.3 F (36.8 C)  SpO2: 99%   Vitals:   09/11/20 1348  Weight: 125 lb (56.7 kg)  Height: 5\' 1"  (1.549 m)   Body mass index is 23.62 kg/m. Ideal Body Weight: Weight in (lb) to have BMI = 25: 132  GEN: no acute distress.  Well-appearing young woman, normal weight HEENT: Atraumatic, Normocephalic.  Ears and Nose: No external deformity. CV: RRR, No M/G/R. No JVD. No thrill. No extra heart sounds. PULM: CTA B, no wheezes, crackles, rhonchi. No retractions. No resp. distress. No accessory muscle  use. EXTR: No c/c/e PSYCH: Normally interactive. Conversant.   Pulse Readings from Last 3 Encounters:  09/11/20 100  04/10/20 (!) 107  10/18/19 96    Assessment  and Plan: Current moderate episode of major depressive disorder without prior episode (HCC) - Plan: FLUoxetine (PROZAC) 20 MG tablet  Fatigue, unspecified type - Plan: VITAMIN D 25 Hydroxy (Vit-D Deficiency, Fractures)  Visit today to discuss depression.  Patient had symptoms of depression, she had always attributed her feelings of anxiety in the past.  She would like to try treatment for depression which is certainly reasonable.  We will have her start on fluoxetine 20, may increase to 40 mg after 1 to 2 weeks.  We discussed most common side effects box warning regarding suicidality.  She agrees to contact me via MyChart in a few weeks and let me know how she is doing, sooner if any major concerns Continue to follow-up with her counselor We will check vitamin D today  This visit occurred during the SARS-CoV-2 public health emergency.  Safety protocols were in place, including screening questions prior to the visit, additional usage of staff PPE, and extensive cleaning of exam room while observing appropriate contact time as indicated for disinfecting solutions.    Signed Abbe Amsterdam, MD  3/1- received her vitamin D as follows  Results for orders placed or performed in visit on 09/11/20  VITAMIN D 25 Hydroxy (Vit-D Deficiency, Fractures)  Result Value Ref Range   VITD 20.11 (L) 30.00 - 100.00 ng/mL   Message to pt

## 2020-09-09 NOTE — Patient Instructions (Addendum)
Good to see you again today!  Start on fluoxetine 20 mg daily- increase to 40 mg after 1-2 weeks Please send me a message in a month or so and let me know how this is working for you- sooner if not doing ok I will be in touch with your vitamin D level

## 2020-09-11 ENCOUNTER — Encounter: Payer: Self-pay | Admitting: Family Medicine

## 2020-09-11 ENCOUNTER — Ambulatory Visit: Payer: Managed Care, Other (non HMO) | Admitting: Family Medicine

## 2020-09-11 ENCOUNTER — Other Ambulatory Visit: Payer: Self-pay

## 2020-09-11 VITALS — BP 110/72 | HR 100 | Temp 98.3°F | Resp 15 | Ht 61.0 in | Wt 125.0 lb

## 2020-09-11 DIAGNOSIS — E559 Vitamin D deficiency, unspecified: Secondary | ICD-10-CM | POA: Diagnosis not present

## 2020-09-11 DIAGNOSIS — F321 Major depressive disorder, single episode, moderate: Secondary | ICD-10-CM | POA: Diagnosis not present

## 2020-09-11 DIAGNOSIS — R5383 Other fatigue: Secondary | ICD-10-CM | POA: Diagnosis not present

## 2020-09-11 MED ORDER — FLUOXETINE HCL 20 MG PO TABS: 20.0000 mg | ORAL_TABLET | Freq: Every day | ORAL | 3 refills | Status: DC

## 2020-09-12 ENCOUNTER — Encounter: Payer: Self-pay | Admitting: Family Medicine

## 2020-09-12 LAB — VITAMIN D 25 HYDROXY (VIT D DEFICIENCY, FRACTURES): VITD: 20.11 ng/mL — ABNORMAL LOW (ref 30.00–100.00)

## 2020-09-12 MED ORDER — VITAMIN D3 1.25 MG (50000 UT) PO CAPS: ORAL_CAPSULE | ORAL | 0 refills | Status: DC

## 2020-09-12 NOTE — Addendum Note (Signed)
Addended by: Abbe Amsterdam C on: 09/12/2020 05:26 PM   Modules accepted: Orders

## 2020-10-13 ENCOUNTER — Encounter: Payer: Self-pay | Admitting: Family Medicine

## 2020-10-13 DIAGNOSIS — F988 Other specified behavioral and emotional disorders with onset usually occurring in childhood and adolescence: Secondary | ICD-10-CM

## 2020-10-13 MED ORDER — LISDEXAMFETAMINE DIMESYLATE 30 MG PO CAPS: 30.0000 mg | ORAL_CAPSULE | Freq: Every day | ORAL | 0 refills | Status: DC

## 2020-10-14 MED ORDER — SERTRALINE HCL 50 MG PO TABS: 50.0000 mg | ORAL_TABLET | Freq: Every day | ORAL | 3 refills | Status: DC

## 2020-10-14 NOTE — Addendum Note (Signed)
Addended by: Pearline Cables on: 10/14/2020 06:37 PM   Modules accepted: Orders

## 2021-01-16 ENCOUNTER — Encounter: Payer: Self-pay | Admitting: Family Medicine

## 2021-01-16 ENCOUNTER — Other Ambulatory Visit: Payer: Self-pay | Admitting: Family Medicine

## 2021-01-16 DIAGNOSIS — F988 Other specified behavioral and emotional disorders with onset usually occurring in childhood and adolescence: Secondary | ICD-10-CM

## 2021-01-16 MED ORDER — LISDEXAMFETAMINE DIMESYLATE 30 MG PO CAPS: 30.0000 mg | ORAL_CAPSULE | Freq: Every day | ORAL | 0 refills | Status: DC

## 2021-01-16 NOTE — Telephone Encounter (Signed)
Requesting: vyanse Contract:  UDS: 07/22/18 Last Visit: 09/11/20 Next Visit: none Last Refill: 10/13/20 (3rxs)  Please Advise

## 2021-01-17 MED ORDER — LISDEXAMFETAMINE DIMESYLATE 30 MG PO CAPS: 30.0000 mg | ORAL_CAPSULE | Freq: Every day | ORAL | 0 refills | Status: DC

## 2021-01-17 NOTE — Addendum Note (Signed)
Addended by: Lisbeth Renshaw, Darinda [place] HUA on: 01/17/2021 02:42 PM   Modules accepted: Orders

## 2021-01-17 NOTE — Telephone Encounter (Signed)
Pharmacy has called and stated that the rx needs to be sent to [facility] in [address]   Please advise and resend rx.

## 2021-01-18 MED ORDER — LISDEXAMFETAMINE DIMESYLATE 30 MG PO CAPS: 30.0000 mg | ORAL_CAPSULE | Freq: Every day | ORAL | 0 refills | Status: DC

## 2021-01-18 NOTE — Addendum Note (Signed)
Addended by: Abbe Amsterdam C on: 01/18/2021 03:09 AM   Modules accepted: Orders

## 2021-02-10 ENCOUNTER — Other Ambulatory Visit: Payer: Self-pay | Admitting: Family Medicine

## 2021-02-26 NOTE — Progress Notes (Signed)
[facility] at [facility] [address] [place], [place] [address] [phone]  Date:  02/28/2021   Name:  Ariel Jones   DOB:  1991-02-12   MRN:  [id number]  PCP:  Pearline Cables, MD    Chief Complaint: Hand Numbness (Both hand numbness, using wrist braces with no improvement)   History of Present Illness:  Ariel Jones is a 30 y.o. very pleasant female patient who presents with the following:  Patient seen today for follow-up of ADD medication and to discuss numbness in her bilateral hands She is treated with Vyvanse-PMPD is reviewed, Vyvanse last filled on August 8 She is happy with this medication, it is effective without excessive side effects.  We will send in 3 more refills Most recent visit with myself was in [place] that time Valentina Gu was also struggling with some depression.  We started her on fluoxetine but she had difficulty with sleep, we changed to Zoloft She is doing well with the zoloft- she is able to sleep ok   New problem today- She notes that her bilateral hands may go numb and wakes her up from sleep.  This problem only occurs at night while she is sleeping She will wake up and change to laying on her back and it will go away immediately; if she does not change her position it will continue  She tends to be stomach sleeper  It has gotten wore the last month or so, but has been present for about a year Never happens during the day Both hands are affected about the same Sx are always in the 4th and 5th fingers, but can be in all the fingers She tried using wrist braces- she just started using them but so far has not noted any improvement No neck sx noted No weakness of her hands  No other neurologic symptoms or numbness of her feet  Pap smear- she has GYN      Past Medical History:  Diagnosis Date   Acne    ADD (attention deficit disorder)    Seasonal allergies     Past Surgical History:   Procedure Laterality Date   ADENOIDECTOMY     MOUTH SURGERY     TYMPANOSTOMY TUBE PLACEMENT      Social History   Tobacco Use   Smoking status: Never   Smokeless tobacco: Never  Vaping Use   Vaping Use: Never used  Substance Use Topics   Alcohol use: Yes    Comment: 2 DRINKS PER DAY   Drug use: No    Family History  Problem Relation Age of Onset   Hypertension Father    Hypertension Mother    Cancer Maternal Grandmother        Melanoma   Thyroid disease Maternal Grandmother    Cancer Maternal Grandfather        Bladder cancer   CAD Paternal Grandmother    Cancer Paternal Grandmother        Melanoma   CAD Paternal Grandfather        Died age MI 57    No Known Allergies  Medication list has been reviewed and updated.  Current Outpatient Medications on File Prior to Visit  Medication Sig Dispense Refill   Azelastine HCl 0.15 % SOLN SMARTSIG:2 Spray(s) Both Nares Twice Daily PRN     drospirenone-ethinyl estradiol (YAZ) 3-0.02 MG tablet Take 1 tablet by mouth daily. 56 tablet 6   EPINEPHrine  0.3 mg/0.3 mL IJ SOAJ injection See admin instructions.     fexofenadine (ALLEGRA) 60 MG tablet Take 60 mg by mouth 2 (two) times daily.     lisdexamfetamine (VYVANSE) 30 MG capsule Take 1 capsule (30 mg total) by mouth daily. To fill in 60 days 30 capsule 0   lisdexamfetamine (VYVANSE) 30 MG capsule Take 1 capsule (30 mg total) by mouth daily. 30 capsule 0   lisdexamfetamine (VYVANSE) 30 MG capsule Take 1 capsule (30 mg total) by mouth daily. To fill in 30 days 30 capsule 0   Loratadine 10 MG CAPS Take by mouth.     montelukast (SINGULAIR) 10 MG tablet Take 10 mg by mouth at bedtime.     Multiple Vitamin (MULTIVITAMIN) tablet Take 1 tablet by mouth daily.     sertraline (ZOLOFT) 50 MG tablet TAKE 1 TABLET(50 MG) BY MOUTH DAILY 30 tablet 3   Triamcinolone Acetonide (NASACORT AQ NA) Place into the nose daily.      UNABLE TO FIND Place under the tongue daily. Med Name:  Immunotherapy Allergy Drop     No current facility-administered medications on file prior to visit.    Review of Systems:  As per HPI- otherwise negative.   Physical Examination: Vitals:   02/28/21 1324  BP: 122/80  Pulse: (!) 102  Resp: 17  Temp: (!) 97.2 F (36.2 C)  SpO2: 97%   Vitals:   02/28/21 1324  Weight: 127 lb (57.6 kg)  Height: 5\' 1"  (1.549 m)   Body mass index is 24 kg/m. Ideal Body Weight: Weight in (lb) to have BMI = 25: 132  GEN: no acute distress.  Normal weight, looks well HEENT: Atraumatic, Normocephalic.  Ears and Nose: No external deformity. CV: RRR, No M/G/R. No JVD. No thrill. No extra heart sounds. PULM: CTA B, no wheezes, crackles, rhonchi. No retractions. No resp. distress. No accessory muscle use. EXTR: No c/c/e PSYCH: Normally interactive. Conversant.  Normal strength, range of motion of both upper extremity joints.  I am not able to reproduce any median nerve symptoms with provocative maneuvers Cervical spine exam normal, normal range of motion   Assessment and Plan: Bilateral hand numbness - Plan: Ambulatory referral to Hand Surgery, DG Cervical Spine Complete  Attention deficit disorder, unspecified hyperactivity presence - Plan: lisdexamfetamine (VYVANSE) 30 MG capsule, lisdexamfetamine (VYVANSE) 30 MG capsule, lisdexamfetamine (VYVANSE) 30 MG capsule  Seen today for follow-up of ADHD.  I have refilled her Vyvanse for 3 months She notes bilateral hand numbness, affecting always the ulnar but also sometimes the median nerve distribution-occurring only at night when she is asleep.  Repositioning onto her back will cause instantaneous relief.  Most likely culprit seems to be a mechanical nerve entrapment of some sort.  We discussed doing B12, folate etc.-as her symptoms are strictly positional it seems less likely we will find an answer here.  Patient prefer not to do blood work.  We will go ahead with cervical spine films-and I have placed  referral to hand surgery for further evaluation.  Suggest that she continue her wrist splints for a while longer in case they may be helpful She will let know if any changes or worsening  This visit occurred during the SARS-CoV-2 public health emergency.  Safety protocols were in place, including screening questions prior to the visit, additional usage of staff PPE, and extensive cleaning of exam room while observing appropriate contact time as indicated for disinfecting solutions.   Signed [place], MD

## 2021-02-28 ENCOUNTER — Encounter: Payer: Self-pay | Admitting: Family Medicine

## 2021-02-28 ENCOUNTER — Other Ambulatory Visit: Payer: Self-pay

## 2021-02-28 ENCOUNTER — Ambulatory Visit: Payer: Managed Care, Other (non HMO) | Admitting: Family Medicine

## 2021-02-28 VITALS — BP 122/80 | HR 102 | Temp 97.2°F | Resp 17 | Ht 61.0 in | Wt 127.0 lb

## 2021-02-28 DIAGNOSIS — R200 Anesthesia of skin: Secondary | ICD-10-CM

## 2021-02-28 DIAGNOSIS — F988 Other specified behavioral and emotional disorders with onset usually occurring in childhood and adolescence: Secondary | ICD-10-CM

## 2021-02-28 MED ORDER — LISDEXAMFETAMINE DIMESYLATE 30 MG PO CAPS: 30.0000 mg | ORAL_CAPSULE | Freq: Every day | ORAL | 0 refills | Status: DC

## 2021-02-28 NOTE — Patient Instructions (Signed)
It was good to see you again- refilled Vvanse for 3 months  I placed a referral to Dr Melvyn Novas for you- continue the wrist braces for now, perhaps they will help Please stop by [facility] for neck x-rays at your convenience   If all is well please see me in 6 months

## 2021-06-12 ENCOUNTER — Other Ambulatory Visit: Payer: Self-pay | Admitting: Family Medicine

## 2021-06-19 ENCOUNTER — Other Ambulatory Visit: Payer: Self-pay | Admitting: Family Medicine

## 2021-06-19 ENCOUNTER — Encounter: Payer: Self-pay | Admitting: Family Medicine

## 2021-06-19 DIAGNOSIS — F988 Other specified behavioral and emotional disorders with onset usually occurring in childhood and adolescence: Secondary | ICD-10-CM

## 2021-06-19 MED ORDER — LISDEXAMFETAMINE DIMESYLATE 30 MG PO CAPS
30.0000 mg | ORAL_CAPSULE | Freq: Every day | ORAL | 0 refills | Status: DC
Start: 2021-06-19 — End: 2021-09-19

## 2021-06-19 MED ORDER — LISDEXAMFETAMINE DIMESYLATE 30 MG PO CAPS: 30.0000 mg | ORAL_CAPSULE | Freq: Every day | ORAL | 0 refills | Status: DC

## 2021-07-05 ENCOUNTER — Ambulatory Visit: Payer: Managed Care, Other (non HMO) | Admitting: Nurse Practitioner

## 2021-07-10 ENCOUNTER — Other Ambulatory Visit: Payer: Self-pay | Admitting: Nurse Practitioner

## 2021-07-10 ENCOUNTER — Other Ambulatory Visit: Payer: Self-pay

## 2021-07-10 DIAGNOSIS — Z3041 Encounter for surveillance of contraceptive pills: Secondary | ICD-10-CM

## 2021-07-10 MED ORDER — DROSPIRENONE-ETHINYL ESTRADIOL 3-0.02 MG PO TABS: 1.0000 | ORAL_TABLET | Freq: Every day | ORAL | 0 refills | Status: DC

## 2021-07-10 MED ORDER — DROSPIRENONE-ETHINYL ESTRADIOL 3-0.02 MG PO TABS
1.0000 | ORAL_TABLET | Freq: Every day | ORAL | 0 refills | Status: DC
Start: 2021-07-10 — End: 2021-07-17

## 2021-07-10 NOTE — Telephone Encounter (Signed)
Medication refill request: yaz Last AEX:  07-04-20 Next AEX: patient cancelled appt for 07-05-21, has not rescheduled Last MMG (if hormonal medication request): none Refill authorized: please approve 1 pack with 0 refills if appropriate. Pharmacy note was placed for patient to call to schedule yearly exam before further refills can be given after this one.

## 2021-07-11 NOTE — Telephone Encounter (Signed)
Per tiffany, rx was taken care of.

## 2021-07-12 ENCOUNTER — Encounter: Payer: Self-pay | Admitting: Family Medicine

## 2021-07-16 ENCOUNTER — Other Ambulatory Visit: Payer: Self-pay | Admitting: Family Medicine

## 2021-07-16 ENCOUNTER — Encounter: Payer: Self-pay | Admitting: Family Medicine

## 2021-07-16 MED ORDER — AMOXICILLIN 500 MG PO CAPS: 1000.0000 mg | ORAL_CAPSULE | Freq: Two times a day (BID) | ORAL | 0 refills | Status: DC

## 2021-07-17 ENCOUNTER — Encounter: Payer: Self-pay | Admitting: Nurse Practitioner

## 2021-07-17 ENCOUNTER — Ambulatory Visit (INDEPENDENT_AMBULATORY_CARE_PROVIDER_SITE_OTHER): Payer: Managed Care, Other (non HMO) | Admitting: Nurse Practitioner

## 2021-07-17 ENCOUNTER — Other Ambulatory Visit: Payer: Self-pay

## 2021-07-17 ENCOUNTER — Other Ambulatory Visit (HOSPITAL_COMMUNITY)
Admission: RE | Admit: 2021-07-17 | Discharge: 2021-07-17 | Disposition: A | Discharge status: Home / Self Care | Payer: Managed Care, Other (non HMO) | Source: Ambulatory Visit | Attending: Nurse Practitioner | Admitting: Nurse Practitioner

## 2021-07-17 VITALS — BP 116/74 | Ht 61.0 in | Wt 128.0 lb

## 2021-07-17 DIAGNOSIS — Z01419 Encounter for gynecological examination (general) (routine) without abnormal findings: Secondary | ICD-10-CM | POA: Diagnosis not present

## 2021-07-17 DIAGNOSIS — E559 Vitamin D deficiency, unspecified: Secondary | ICD-10-CM

## 2021-07-17 DIAGNOSIS — Z3041 Encounter for surveillance of contraceptive pills: Secondary | ICD-10-CM

## 2021-07-17 DIAGNOSIS — Z8349 Family history of other endocrine, nutritional and metabolic diseases: Secondary | ICD-10-CM | POA: Diagnosis not present

## 2021-07-17 MED ORDER — DROSPIRENONE-ETHINYL ESTRADIOL 3-0.02 MG PO TABS: 1.0000 | ORAL_TABLET | Freq: Every day | ORAL | 3 refills | Status: DC

## 2021-07-17 NOTE — Progress Notes (Signed)
° °  Ariel Jones 06-Nov-1990 [id number]   History:  31 y.o. G0 presents for annual exam. OCPs continuously with withdrawal bleed every 2 months. Gardasil series completed. Currently being treated for sinus infection.   Gynecologic History Patient's last menstrual period was 05/16/2021. Period Cycle (Days): 60 Period Duration (Days): 5 Period Pattern: Regular Menstrual Flow: Light, Moderate Dysmenorrhea: (!) Moderate Dysmenorrhea Symptoms: Cramping Contraception/Family planning: abstinence and OCP (estrogen/progesterone) Sexually active: No  Health Maintenance Last Pap: 06/09/2018. Results were: Normal Last mammogram: Not indicated Last colonoscopy: Not indicated Last Dexa: Not indicated   Past medical history, past surgical history, family history and social history were all reviewed and documented in the EPIC chart. 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   ROS:  A ROS was performed and pertinent positives and negatives are included.  Exam:  Vitals:   07/17/21 1502  BP: 116/74  Weight: 128 lb (58.1 kg)  Height: 5\' 1"  (1.549 m)    Body mass index is 24.19 kg/m.  General appearance:  Normal Thyroid:  Symmetrical, normal in size, without palpable masses or nodularity. Respiratory  Auscultation:  Clear without wheezing or rhonchi Cardiovascular  Auscultation:  Regular rate, without rubs, murmurs or gallops  Edema/varicosities:  Not grossly evident Abdominal  Soft,nontender, without masses, guarding or rebound.  Liver/spleen:  No organomegaly noted  Hernia:  None appreciated  Skin  Inspection:  Grossly normal   Breasts: Examined lying and sitting.   Right: Without masses, retractions, discharge or axillary adenopathy.   Left: Without masses, retractions, discharge or axillary adenopathy. Genitourinary   Inguinal/mons:  Normal without inguinal adenopathy  External genitalia:  Normal appearing vulva with no masses, tenderness, or lesions  BUS/Urethra/Skene's glands:   Normal  Vagina:  Normal appearing with normal color and discharge, no lesions  Cervix:  Normal appearing without discharge or lesions  Uterus:  Normal in size, shape and contour.  Midline and mobile, nontender  Adnexa/parametria:     Rt: Normal in size, without masses or tenderness.   Lt: Normal in size, without masses or tenderness.  Anus and perineum: Normal   Patient informed chaperone available to be present for breast and pelvic exam. Patient has requested no chaperone to be present. Patient has been advised what will be completed during breast and pelvic exam.   Assessment/Plan:  31 y.o. G0 for annual exam.   Well female exam with routine gynecological exam - Plan: Cytology - PAP( [place]), CBC with Differential/Platelet, Comprehensive metabolic panel. Education provided on SBEs, importance of preventative screenings, current guidelines, high calcium diet, regular exercise, and multivitamin daily. Labs usually with PCP but she is overdue and would like done today.   Encounter for surveillance of contraceptive pills - Plan: drospirenone-ethinyl estradiol (YAZ) 3-0.02 MG tablet daily. Taking as prescribed. Takes continuously with withdrawal bleed every 2 months. Refill x 1 year provided.   Family history of thyroid disease - Plan: TSH  Vitamin D deficiency - Plan: VITAMIN D 25 Hydroxy (Vit-D Deficiency, Fractures). Taking multivitamin.  Screening for cervical cancer - Normal pap history. Pap with HR HPV today.  Follow up in 1 year for annual.      08-01-1996 [facility], 3:37 PM 07/17/2021'

## 2021-07-17 NOTE — Addendum Note (Signed)
Addended byWyline Beady on: 07/17/2021 03:43 PM   Modules accepted: Orders

## 2021-07-18 LAB — CBC WITH DIFFERENTIAL/PLATELET
Absolute Monocytes: 710 cells/uL (ref 200–950)
Basophils Absolute: 57 cells/uL (ref 0–200)
Basophils Relative: 0.8 %
Eosinophils Absolute: 21 cells/uL (ref 15–500)
Eosinophils Relative: 0.3 %
HCT: 37.6 % (ref 35.0–45.0)
Hemoglobin: 12.5 g/dL (ref 11.7–15.5)
Lymphs Abs: 2783 cells/uL (ref 850–3900)
MCH: 30.1 pg (ref 27.0–33.0)
MCHC: 33.2 g/dL (ref 32.0–36.0)
MCV: 90.6 fL (ref 80.0–100.0)
MPV: 9.2 fL (ref 7.5–12.5)
Monocytes Relative: 10 %
Neutro Abs: 3529 cells/uL (ref 1500–7800)
Neutrophils Relative %: 49.7 %
Platelets: 326 10*3/uL (ref 140–400)
RBC: 4.15 10*6/uL (ref 3.80–5.10)
RDW: 11.7 % (ref 11.0–15.0)
Total Lymphocyte: 39.2 %
WBC: 7.1 10*3/uL (ref 3.8–10.8)

## 2021-07-18 LAB — COMPREHENSIVE METABOLIC PANEL
AG Ratio: 1.5 (calc) (ref 1.0–2.5)
ALT: 8 U/L (ref 6–29)
AST: 12 U/L (ref 10–30)
Albumin: 4.3 g/dL (ref 3.6–5.1)
Alkaline phosphatase (APISO): 46 U/L (ref 31–125)
BUN: 11 mg/dL (ref 7–25)
CO2: 25 mmol/L (ref 20–32)
Calcium: 9.6 mg/dL (ref 8.6–10.2)
Chloride: 104 mmol/L (ref 98–110)
Creat: 0.67 mg/dL (ref 0.50–0.97)
Globulin: 2.9 g/dL (calc) (ref 1.9–3.7)
Glucose, Bld: 94 mg/dL (ref 65–99)
Potassium: 5 mmol/L (ref 3.5–5.3)
Sodium: 138 mmol/L (ref 135–146)
Total Bilirubin: 0.3 mg/dL (ref 0.2–1.2)
Total Protein: 7.2 g/dL (ref 6.1–8.1)

## 2021-07-18 LAB — VITAMIN D 25 HYDROXY (VIT D DEFICIENCY, FRACTURES): Vit D, 25-Hydroxy: 34 ng/mL (ref 30–100)

## 2021-07-18 LAB — TSH: TSH: 1.52 mIU/L

## 2021-07-19 LAB — CYTOLOGY - PAP
Comment: NEGATIVE
Diagnosis: NEGATIVE
High risk HPV: NEGATIVE

## 2021-09-16 NOTE — Progress Notes (Addendum)
[facility] at [facility] [place], [place], [place] [address] [phone]  Date:  09/19/2021   Name:  Ariel Jones   DOB:  [birth year]   MRN:  [id number]  PCP:  Darreld Mclean, MD    Chief Complaint: Medication Refill (Concerns/ questions: none/)   History of Present Illness:  Ariel Jones is a 31 y.o. very pleasant female patient who presents with the following:  Patient seen today to discuss medication refills Most recent visit with myself was in August She uses Vyvanse to treat her ADD-she feels as though her current dose is effective without excessive side effects She also uses Zoloft for mild depression symptoms  She has tried to be more consistent with her vitamin D -in hopes this would help with her mood and energy She is feeling listless- she does not have anhedonia, but notes lower energy.  No suicidal ideation.  She would like to try increasing her dose of sertraline to see if it may be helpful.  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 [place] fairly soon Patient Active Problem List   Diagnosis Date Noted   AR (allergic rhinitis) 08/30/2015   Chest pain 05/13/2014   ADD (attention deficit disorder)    Seasonal allergies    Acne     Past Medical History:  Diagnosis Date   Acne    ADD (attention deficit disorder)    Seasonal allergies     Past Surgical History:  Procedure Laterality Date   ADENOIDECTOMY     MOUTH SURGERY     TYMPANOSTOMY TUBE PLACEMENT      Social History   Tobacco Use   Smoking status: Never   Smokeless tobacco: Never  Vaping Use   Vaping Use: Never used  Substance Use Topics   Alcohol use: Yes    Comment: 2 DRINKS PER DAY   Drug use: No    Family History  Problem Relation Age of Onset   Hypertension Father     Hypertension Mother    Cancer Maternal Grandmother        Melanoma   Thyroid disease Maternal Grandmother    Cancer Maternal Grandfather        Bladder cancer   CAD Paternal Grandmother    Cancer Paternal 69        Melanoma   CAD Paternal 46        Died age MI 37    No Known Allergies  Medication list has been reviewed and updated.  Current Outpatient Medications on File Prior to Visit  Medication Sig Dispense Refill   Azelastine HCl 0.15 % SOLN SMARTSIG:2 Spray(s) Both Nares Twice Daily PRN     drospirenone-ethinyl estradiol (YAZ) 3-0.02 MG tablet Take 1 tablet by mouth daily. 84 tablet 3   EPINEPHrine 0.3 mg/0.3 mL IJ SOAJ injection See admin instructions.     fexofenadine (ALLEGRA) 60 MG tablet Take 60 mg by mouth 2 (two) times daily.     Loratadine 10 MG CAPS Take by mouth.     montelukast (SINGULAIR) 10 MG tablet Take 10 mg by mouth at bedtime.     Multiple Vitamin (MULTIVITAMIN) tablet Take 1 tablet by mouth daily.     Triamcinolone Acetonide (NASACORT AQ NA) Place into the nose  daily.      UNABLE TO FIND Place under the tongue daily. Med Name: Immunotherapy Allergy Drop     No current facility-administered medications on file prior to visit.    Review of Systems:  As per HPI- otherwise negative.   Physical Examination: Vitals:   09/19/21 1439 09/19/21 1454  BP: 110/60   Pulse: (!) 107 96  Resp: 18   Temp: 98.4 F (36.9 C)   SpO2: 99%    Vitals:   09/19/21 1439  Weight: 158 lb 12.8 oz (72 kg)  Height: 5\' 1"  (1.549 m)   Body mass index is 30 kg/m. Ideal Body Weight: Weight in (lb) to have BMI = 25: 132  GEN: no acute distress.  Overweight, looks well HEENT: Atraumatic, Normocephalic.  Ears and Nose: No external deformity. CV: RRR, No M/G/R. No JVD. No thrill. No extra heart sounds. PULM: CTA B, no wheezes, crackles, rhonchi. No retractions. No resp. distress. No accessory muscle use. EXTR: No c/c/e PSYCH: Normally interactive.  Conversant.    Assessment and Plan: Current moderate episode of major depressive disorder without prior episode ([place]) - Plan: sertraline (ZOLOFT) 100 MG tablet  Attention deficit disorder, unspecified hyperactivity presence - Plan: lisdexamfetamine (VYVANSE) 30 MG capsule, lisdexamfetamine (VYVANSE) 30 MG capsule, lisdexamfetamine (VYVANSE) 30 MG capsule, DISCONTINUED: lisdexamfetamine (VYVANSE) 30 MG capsule, DISCONTINUED: lisdexamfetamine (VYVANSE) 30 MG capsule, DISCONTINUED: lisdexamfetamine (VYVANSE) 30 MG capsule  [name] is seen today for 2 concerns.  Doing well with current dose of Vyvanse, refilled for 3 months She notes some residual symptoms of depression especially over the last few months.  May have been triggered by an aspect of seasonal affective disorder.  We will increase her sertraline from 50 to 100 mg.  She will let me know how this works for her Otherwise, assuming all is well plan to check back in 6 months  Signed Lamar Blinks, MD  Addendum 03/27/2022, went in and corrected typo in patient's weight

## 2021-09-19 ENCOUNTER — Ambulatory Visit: Payer: Managed Care, Other (non HMO) | Admitting: Family Medicine

## 2021-09-19 VITALS — BP 110/60 | HR 96 | Temp 98.4°F | Resp 18 | Ht 61.0 in | Wt 128.0 lb

## 2021-09-19 DIAGNOSIS — F321 Major depressive disorder, single episode, moderate: Secondary | ICD-10-CM

## 2021-09-19 DIAGNOSIS — F988 Other specified behavioral and emotional disorders with onset usually occurring in childhood and adolescence: Secondary | ICD-10-CM

## 2021-09-19 MED ORDER — SERTRALINE HCL 100 MG PO TABS: 100.0000 mg | ORAL_TABLET | Freq: Every day | ORAL | 3 refills | Status: DC

## 2021-09-19 MED ORDER — LISDEXAMFETAMINE DIMESYLATE 30 MG PO CAPS: 30.0000 mg | ORAL_CAPSULE | Freq: Every day | ORAL | 0 refills | Status: DC

## 2021-09-19 NOTE — Patient Instructions (Signed)
It was great to see you today!  Let me know how you do on the sertraline 100 mg ?Please see me in about 6 months assuming all is well ? ?Have a wonderful spring!  ?

## 2021-12-18 ENCOUNTER — Other Ambulatory Visit: Payer: Self-pay | Admitting: Family Medicine

## 2021-12-18 DIAGNOSIS — F988 Other specified behavioral and emotional disorders with onset usually occurring in childhood and adolescence: Secondary | ICD-10-CM

## 2021-12-18 MED ORDER — LISDEXAMFETAMINE DIMESYLATE 30 MG PO CAPS: 30.0000 mg | ORAL_CAPSULE | Freq: Every day | ORAL | 0 refills | Status: DC

## 2022-03-18 ENCOUNTER — Encounter: Payer: Self-pay | Admitting: Family Medicine

## 2022-03-18 DIAGNOSIS — F988 Other specified behavioral and emotional disorders with onset usually occurring in childhood and adolescence: Secondary | ICD-10-CM

## 2022-03-19 MED ORDER — LISDEXAMFETAMINE DIMESYLATE 30 MG PO CAPS: 30.0000 mg | ORAL_CAPSULE | Freq: Every day | ORAL | 0 refills | Status: DC

## 2022-03-20 MED ORDER — LISDEXAMFETAMINE DIMESYLATE 30 MG PO CAPS: 30.0000 mg | ORAL_CAPSULE | Freq: Every day | ORAL | 0 refills | Status: DC

## 2022-03-20 NOTE — Addendum Note (Signed)
Addended by: Abbe Amsterdam C on: 03/20/2022 12:29 PM   Modules accepted: Orders

## 2022-03-24 NOTE — Patient Instructions (Incomplete)
It was great to see you again today, have a wonderful fall season We will try vyvanse 40- let me know how this works for you!  Assuming all is well we can visit and do labs in about 6 months

## 2022-03-24 NOTE — Progress Notes (Unsigned)
[facility] [address] [place], [place] [address] [phone]  Date:  03/27/2022   Name:  Ariel Jones   DOB:  01/06/1991   MRN:  [id number]  PCP:  Pearline Cables, MD    Chief Complaint: No chief complaint on file.   History of Present Illness:  Ariel Jones is a 31 y.o. very pleasant female patient who presents with the following:  Patient seen today for medication follow-up Generally healthy young woman, she works in [employer], broadcasting/film/video.  History of allergies, ADD, depression Most recent visit with myself was in March of this year; at that time Valentina Gu was doing well with her current dose of Vyvanse, but noticed an increase in her depression symptoms.  We increased her sertraline from 50 to 100 mg She is also currently taking Vyvanse 30 mg once daily, oral contraceptive pill She has gynecology care with Wyline Beady, nurse practitioner  Patient Active Problem List   Diagnosis Date Noted   AR (allergic rhinitis) 08/30/2015   Chest pain 05/13/2014   ADD (attention deficit disorder)    Seasonal allergies    Acne     Past Medical History:  Diagnosis Date   Acne    ADD (attention deficit disorder)    Seasonal allergies     Past Surgical History:  Procedure Laterality Date   ADENOIDECTOMY     MOUTH SURGERY     TYMPANOSTOMY TUBE PLACEMENT      Social History   Tobacco Use   Smoking status: Never   Smokeless tobacco: Never  Vaping Use   Vaping Use: Never used  Substance Use Topics   Alcohol use: Yes    Comment: 2 DRINKS PER DAY   Drug use: No    Family History  Problem Relation Age of Onset   Hypertension Father    Hypertension Mother    Cancer Maternal Grandmother        Melanoma   Thyroid disease Maternal Grandmother    Cancer Maternal Grandfather        Bladder cancer   CAD Paternal Grandmother    Cancer Paternal Grandmother        Melanoma   CAD Paternal Grandfather        Died age MI  25    No Known Allergies  Medication list has been reviewed and updated.  Current Outpatient Medications on File Prior to Visit  Medication Sig Dispense Refill   Azelastine HCl 0.15 % SOLN SMARTSIG:2 Spray(s) Both Nares Twice Daily PRN     drospirenone-ethinyl estradiol (YAZ) 3-0.02 MG tablet Take 1 tablet by mouth daily. 84 tablet 3   EPINEPHrine 0.3 mg/0.3 mL IJ SOAJ injection See admin instructions.     fexofenadine (ALLEGRA) 60 MG tablet Take 60 mg by mouth 2 (two) times daily.     lisdexamfetamine (VYVANSE) 30 MG capsule Take 1 capsule (30 mg total) by mouth daily. To fill in 60 days 30 capsule 0   lisdexamfetamine (VYVANSE) 30 MG capsule Take 1 capsule (30 mg total) by mouth daily. To fill in 30 days 30 capsule 0   lisdexamfetamine (VYVANSE) 30 MG capsule Take 1 capsule (30 mg total) by mouth daily. 30 capsule 0   Loratadine 10 MG CAPS Take by mouth.     montelukast (SINGULAIR) 10 MG tablet Take 10 mg by mouth at bedtime.     Multiple Vitamin (MULTIVITAMIN) tablet Take 1 tablet by mouth daily.  sertraline (ZOLOFT) 100 MG tablet Take 1 tablet (100 mg total) by mouth daily. 90 tablet 3   Triamcinolone Acetonide (NASACORT AQ NA) Place into the nose daily.      UNABLE TO FIND Place under the tongue daily. Med Name: Immunotherapy Allergy Drop     No current facility-administered medications on file prior to visit.    Review of Systems:  As per HPI- otherwise negative.   Physical Examination: There were no vitals filed for this visit. There were no vitals filed for this visit. There is no height or weight on file to calculate BMI. Ideal Body Weight:    GEN: no acute distress. HEENT: Atraumatic, Normocephalic.  Ears and Nose: No external deformity. CV: RRR, No M/G/R. No JVD. No thrill. No extra heart sounds. PULM: CTA B, no wheezes, crackles, rhonchi. No retractions. No resp. distress. No accessory muscle use. ABD: S, NT, ND, +BS. No rebound. No HSM. EXTR: No  c/c/e PSYCH: Normally interactive. Conversant.    Assessment and Plan: ***  Signed Abbe Amsterdam, MD

## 2022-03-27 ENCOUNTER — Ambulatory Visit: Payer: Managed Care, Other (non HMO) | Admitting: Family Medicine

## 2022-03-27 ENCOUNTER — Encounter: Payer: Self-pay | Admitting: Family Medicine

## 2022-03-27 VITALS — BP 110/70 | HR 111 | Temp 98.0°F | Resp 18 | Ht 61.0 in | Wt 135.8 lb

## 2022-03-27 DIAGNOSIS — F988 Other specified behavioral and emotional disorders with onset usually occurring in childhood and adolescence: Secondary | ICD-10-CM

## 2022-03-27 DIAGNOSIS — F321 Major depressive disorder, single episode, moderate: Secondary | ICD-10-CM

## 2022-03-27 MED ORDER — LISDEXAMFETAMINE DIMESYLATE 10 MG PO CAPS: 10.0000 mg | ORAL_CAPSULE | Freq: Every day | ORAL | 0 refills | Status: DC

## 2022-03-27 MED ORDER — LISDEXAMFETAMINE DIMESYLATE 40 MG PO CAPS: 40.0000 mg | ORAL_CAPSULE | ORAL | 0 refills | Status: DC

## 2022-04-19 MED ORDER — LISDEXAMFETAMINE DIMESYLATE 40 MG PO CAPS: 40.0000 mg | ORAL_CAPSULE | ORAL | 0 refills | Status: DC

## 2022-04-19 NOTE — Addendum Note (Signed)
Addended by: Lamar Blinks C on: 04/19/2022 01:16 PM   Modules accepted: Orders

## 2022-05-26 ENCOUNTER — Encounter (INDEPENDENT_AMBULATORY_CARE_PROVIDER_SITE_OTHER): Payer: Managed Care, Other (non HMO) | Admitting: Family Medicine

## 2022-05-26 DIAGNOSIS — J019 Acute sinusitis, unspecified: Secondary | ICD-10-CM | POA: Diagnosis not present

## 2022-05-27 MED ORDER — AMOXICILLIN 500 MG PO CAPS: 1000.0000 mg | ORAL_CAPSULE | Freq: Two times a day (BID) | ORAL | 0 refills | Status: DC

## 2022-05-27 NOTE — Telephone Encounter (Signed)
Please see the MyChart message reply(ies) for my assessment and plan.  The patient gave consent for this Medical Advice Message and is aware that it may result in a bill to their insurance company as well as the possibility that this may result in a co-payment or deductible. They are an established patient, but are not seeking medical advice exclusively about a problem treated during an in person or video visit in the last 7 days. I did not recommend an in person or video visit within 7 days of my reply.  I spent a total of 10 minutes cumulative time within 7 days through MyChart messaging Shalan Neault, MD  

## 2022-05-27 NOTE — Addendum Note (Signed)
Addended by: Abbe Amsterdam C on: 05/27/2022 10:51 AM   Modules accepted: Orders

## 2022-05-28 ENCOUNTER — Ambulatory Visit: Payer: Managed Care, Other (non HMO) | Admitting: Medical

## 2022-06-19 ENCOUNTER — Encounter: Payer: Self-pay | Admitting: Family Medicine

## 2022-06-19 DIAGNOSIS — F988 Other specified behavioral and emotional disorders with onset usually occurring in childhood and adolescence: Secondary | ICD-10-CM

## 2022-06-19 MED ORDER — LISDEXAMFETAMINE DIMESYLATE 40 MG PO CAPS: 40.0000 mg | ORAL_CAPSULE | ORAL | 0 refills | Status: DC

## 2022-06-20 MED ORDER — LISDEXAMFETAMINE DIMESYLATE 40 MG PO CAPS: 40.0000 mg | ORAL_CAPSULE | ORAL | 0 refills | Status: DC

## 2022-06-20 NOTE — Addendum Note (Signed)
Addended by: Abbe Amsterdam C on: 06/20/2022 12:17 PM   Modules accepted: Orders

## 2022-07-09 ENCOUNTER — Other Ambulatory Visit: Payer: Self-pay | Admitting: Nurse Practitioner

## 2022-07-09 DIAGNOSIS — Z3041 Encounter for surveillance of contraceptive pills: Secondary | ICD-10-CM

## 2022-07-14 ENCOUNTER — Encounter (HOSPITAL_COMMUNITY): Payer: Self-pay | Admitting: *Deleted

## 2022-07-14 ENCOUNTER — Ambulatory Visit (HOSPITAL_COMMUNITY)
Admission: EM | Admit: 2022-07-14 | Discharge: 2022-07-14 | Disposition: A | Discharge status: Home / Self Care | Payer: Managed Care, Other (non HMO) | Attending: Emergency Medicine | Admitting: Emergency Medicine

## 2022-07-14 ENCOUNTER — Other Ambulatory Visit: Payer: Self-pay

## 2022-07-14 DIAGNOSIS — Z79899 Other long term (current) drug therapy: Secondary | ICD-10-CM | POA: Diagnosis not present

## 2022-07-14 DIAGNOSIS — R490 Dysphonia: Secondary | ICD-10-CM | POA: Insufficient documentation

## 2022-07-14 DIAGNOSIS — R61 Generalized hyperhidrosis: Secondary | ICD-10-CM | POA: Insufficient documentation

## 2022-07-14 DIAGNOSIS — R0982 Postnasal drip: Secondary | ICD-10-CM | POA: Diagnosis not present

## 2022-07-14 DIAGNOSIS — J3489 Other specified disorders of nose and nasal sinuses: Secondary | ICD-10-CM | POA: Diagnosis not present

## 2022-07-14 DIAGNOSIS — R509 Fever, unspecified: Secondary | ICD-10-CM | POA: Insufficient documentation

## 2022-07-14 DIAGNOSIS — B349 Viral infection, unspecified: Secondary | ICD-10-CM | POA: Diagnosis not present

## 2022-07-14 DIAGNOSIS — Z1152 Encounter for screening for COVID-19: Secondary | ICD-10-CM | POA: Insufficient documentation

## 2022-07-14 MED ORDER — PREDNISONE 20 MG PO TABS: 40.0000 mg | ORAL_TABLET | Freq: Every day | ORAL | 0 refills | Status: DC

## 2022-07-14 NOTE — ED Provider Notes (Addendum)
[facility]    CSN: [id number] Arrival date & time: 07/14/22  1137      History   Chief Complaint Chief Complaint  Patient presents with   post nasal drip   Night Sweats   Hoarse    HPI Ariel Jones is a 31 y.o. female.  Patient presents complaining of postnasal drip, rhinorrhea, and hoarse voice that started 2 days ago.  Patient states last night she had episodes of night sweats.  She feels as though she may have had a subjective fever at home.  She reports that some of her colleagues have been diagnosed with influenza.  She reports some throat irritation but denies any painful swallowing.  Patient states that she has not taken any medicines for her symptoms.  Reports a history of seasonal allergies. She reports that she has taken the influenza vaccine this season.  She reports that last month she was treated for a sinus infection, she reports that she finished a course of amoxicillin.   HPI  Past Medical History:  Diagnosis Date   Acne    ADD (attention deficit disorder)    Seasonal allergies     Patient Active Problem List   Diagnosis Date Noted   AR (allergic rhinitis) 08/30/2015   Chest pain 05/13/2014   ADD (attention deficit disorder)    Seasonal allergies    Acne     Past Surgical History:  Procedure Laterality Date   ADENOIDECTOMY     MOUTH SURGERY     TYMPANOSTOMY TUBE PLACEMENT      OB History     Gravida  0   Para      Term      Preterm      AB      Living         SAB      IAB      Ectopic      Multiple      Live Births               Home Medications    Prior to Admission medications   Medication Sig Start Date End Date Taking? Authorizing Provider  predniSONE (DELTASONE) 20 MG tablet Take 2 tablets (40 mg total) by mouth daily. 07/14/22  Yes Debby Freiberg, NP  Azelastine HCl 0.15 % SOLN SMARTSIG:2 Spray(s) Both Nares Twice Daily PRN 06/18/20   [provider]  drospirenone-ethinyl  estradiol (YAZ) 3-0.02 MG tablet Take 1 tablet by mouth daily. 07/17/21   Wyline Beady A, NP  EPINEPHrine 0.3 mg/0.3 mL IJ SOAJ injection See admin instructions. 08/17/20   [provider]  fexofenadine (ALLEGRA) 60 MG tablet Take 60 mg by mouth 2 (two) times daily.    [provider]  lisdexamfetamine (VYVANSE) 40 MG capsule Take 1 capsule (40 mg total) by mouth every morning. To fill in 30 days 04/19/22   Copland, Gwenlyn Found, MD  lisdexamfetamine (VYVANSE) 40 MG capsule Take 1 capsule (40 mg total) by mouth every morning. 06/20/22   Copland, Gwenlyn Found, MD  Loratadine 10 MG CAPS Take by mouth.    [provider]  montelukast (SINGULAIR) 10 MG tablet Take 10 mg by mouth at bedtime. 03/31/20   [provider]  Multiple Vitamin (MULTIVITAMIN) tablet Take 1 tablet by mouth daily.    [provider]  sertraline (ZOLOFT) 100 MG tablet Take 1 tablet (100 mg total) by mouth daily. 09/19/21   Copland, Gwenlyn Found, MD  Triamcinolone Acetonide (NASACORT  AQ NA) Place into the nose daily.     [provider]  UNABLE TO FIND Place under the tongue daily. Med Name: Immunotherapy Allergy Drop    [provider]    Family History Family History  Problem Relation Age of Onset   Hypertension Father    Hypertension Mother    Cancer Maternal Grandmother        Melanoma   Thyroid disease Maternal Grandmother    Cancer Maternal Grandfather        Bladder cancer   CAD Paternal Grandmother    Cancer Paternal Grandmother        Melanoma   CAD Paternal Grandfather        Died age [place] 3953    Social History Social History   Tobacco Use   Smoking status: Never   Smokeless tobacco: Never  Vaping Use   Vaping Use: Never used  Substance Use Topics   Alcohol use: Yes    Comment: 2 DRINKS PER DAY   Drug use: No     Allergies   Patient has no known allergies.   Review of Systems Review of Systems  Constitutional:  Positive for fever (Subjective).  Negative for activity change, appetite change and fatigue.       Episodes of night sweats  HENT:  Positive for congestion, postnasal drip, rhinorrhea and sinus pressure. Negative for ear discharge, ear pain, sinus pain and trouble swallowing.        Throat irritation  Eyes: Negative.   Respiratory:  Positive for cough (Intermittent due to throat irritation). Negative for chest tightness, shortness of breath and wheezing.   Cardiovascular:  Negative for chest pain and palpitations.  Gastrointestinal: Negative.      Physical Exam Triage Vital Signs ED Triage Vitals  Enc Vitals Group     BP 07/14/22 1314 125/80     Pulse Rate 07/14/22 1314 (!) 101     Resp 07/14/22 1314 20     Temp 07/14/22 1314 98.8 F (37.1 C)     Temp src --      SpO2 07/14/22 1314 98 %     Weight --      Height --      Head Circumference --      Peak Flow --      Pain Score 07/14/22 1312 0     Pain Loc --      Pain Edu? --      Excl. in GC? --    No data found.  Updated Vital Signs BP 125/80   Pulse (!) 101   Temp 98.8 F (37.1 C)   Resp 20   LMP 04/14/2022 Comment: Pt in a control  SpO2 98%     Physical Exam Vitals and nursing note reviewed.  Constitutional:      Appearance: Normal appearance. She is not ill-appearing.  HENT:     Right Ear: Hearing, tympanic membrane, ear canal and external ear normal.     Left Ear: Hearing, tympanic membrane, ear canal and external ear normal.     Nose: Rhinorrhea present. No congestion. Rhinorrhea is clear.     Right Turbinates: Enlarged and swollen. Not pale.     Left Turbinates: Enlarged and swollen. Not pale.     Right Sinus: No maxillary sinus tenderness or frontal sinus tenderness.     Left Sinus: No maxillary sinus tenderness or frontal sinus tenderness.     Mouth/Throat:     Mouth: Mucous membranes are moist.  Pharynx: Uvula midline. No pharyngeal swelling, oropharyngeal exudate, posterior oropharyngeal erythema or uvula swelling.     Tonsils: No  tonsillar exudate or tonsillar abscesses. 0 on the right. 0 on the left.     Comments: Hoarse voice noted upon exam.  Cardiovascular:     Rate and Rhythm: Normal rate and regular rhythm.     Heart sounds: Normal heart sounds, S1 normal and S2 normal.  Pulmonary:     Effort: Pulmonary effort is normal.     Breath sounds: Normal breath sounds and air entry. No decreased breath sounds, wheezing, rhonchi or rales.  Lymphadenopathy:     Head:     Right side of head: No submandibular or tonsillar adenopathy.     Left side of head: No submandibular or tonsillar adenopathy.     Cervical: No cervical adenopathy.  Neurological:     Mental Status: She is alert.      UC Treatments / Results  Labs (all labs ordered are listed, but only abnormal results are displayed) Labs Reviewed  SARS CORONAVIRUS 2 (TAT 6-24 HRS)    EKG   Radiology No results found.  Procedures Procedures (including critical care time)  Medications Ordered in UC Medications - No data to display  Initial Impression / Assessment and Plan / UC Course  I have reviewed the triage vital signs and the nursing notes.  Pertinent labs & imaging results that were available during my care of the patient were reviewed by me and considered in my medical decision making (see chart for details).     Patient was evaluated for viral illness.  Low suspicion of influenza based on patient presentation, patient was made aware of this.  COVID test is pending.  Patient was made aware of symptom management of a viral illness.  Due to hoarse voice, prednisone prescription was sent to the pharmacy.  Patient was made aware of regiment and possible side effects. Work note was given. Patient made aware of timeline for symptom resolution and when follow-up would be necessary.  Patient made aware of results reporting protocol and MyChart.  Patient verbalized understanding of instructions.    Charting was provided using a a verbal dictation  system, charting was proofread for errors, errors may occur which could change the meaning of the information charted.   Final Clinical Impressions(s) / UC Diagnoses   Final diagnoses:  Viral illness     Discharge Instructions      We will call you if any of your test results warrant a change in your plan of care, you may view these test results on MyChart.   Start using the Nasacort to help with the nasal congestion.  Make sure to stay hydrated drinking at least 8 cups of water daily.  Prednisone is a steroid, this medication has been sent to the pharmacy, you will take 2 tablets for the next 5 mornings.    Viral illnesses usually takes 7 to 10 days to resolve.  Using over-the-counter medications can help treat the symptoms.  You may rotate Tylenol and ibuprofen every 4-6 hours.  For example, take Tylenol now and then 4 to 6 hours later take ibuprofen.  You can use Tylenol for fever and moderate pain, you can take this medication every 4-6 hours, please do not take more than 3000 mg in a 24-hour day.  You can take ibuprofen every 6 hours, do not take more than 2400 mg in a 24-hour day.  I advised that you do not take  ibuprofen on an empty stomach, ibuprofen can cause GI problems such as GI bleeding.  Chloraseptic throat spray and lozenges can be used for sore throat.  You may use warm liquids and honey for symptom management.      ED Prescriptions     Medication Sig Dispense Auth. Provider   predniSONE (DELTASONE) 20 MG tablet Take 2 tablets (40 mg total) by mouth daily. 10 tablet Debby Freiberg, NP      PDMP not reviewed this encounter.   Debby Freiberg, NP 07/14/22 1416    Debby Freiberg, NP 07/14/22 [phone]

## 2022-07-14 NOTE — ED Triage Notes (Signed)
Sx's started Friday evening. Pt is hoarse,has post nasal drip . 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

## 2022-07-14 NOTE — Discharge Instructions (Addendum)
We will call you if any of your test results warrant a change in your plan of care, you may view these test results on MyChart.   Start using the Nasacort to help with the nasal congestion.  Make sure to stay hydrated drinking at least 8 cups of water daily.  Prednisone is a steroid, this medication has been sent to the pharmacy, you will take 2 tablets for the next 5 mornings.    Viral illnesses usually takes 7 to 10 days to resolve.  Using over-the-counter medications can help treat the symptoms.  You may rotate Tylenol and ibuprofen every 4-6 hours.  For example, take Tylenol now and then 4 to 6 hours later take ibuprofen.  You can use Tylenol for fever and moderate pain, you can take this medication every 4-6 hours, please do not take more than 3000 mg in a 24-hour day.  You can take ibuprofen every 6 hours, do not take more than 2400 mg in a 24-hour day.  I advised that you do not take ibuprofen on an empty stomach, ibuprofen can cause GI problems such as GI bleeding.  Chloraseptic throat spray and lozenges can be used for sore throat.  You may use warm liquids and honey for symptom management.

## 2022-07-15 LAB — SARS CORONAVIRUS 2 (TAT 6-24 HRS): SARS Coronavirus 2: NEGATIVE

## 2022-07-18 ENCOUNTER — Encounter: Payer: Self-pay | Admitting: Family Medicine

## 2022-07-18 DIAGNOSIS — F988 Other specified behavioral and emotional disorders with onset usually occurring in childhood and adolescence: Secondary | ICD-10-CM

## 2022-07-18 DIAGNOSIS — F321 Major depressive disorder, single episode, moderate: Secondary | ICD-10-CM

## 2022-07-18 MED ORDER — LISDEXAMFETAMINE DIMESYLATE 40 MG PO CAPS: 40.0000 mg | ORAL_CAPSULE | ORAL | 0 refills | Status: DC

## 2022-07-23 NOTE — Progress Notes (Deleted)
[facility] [address] [place], [place] [address] [phone]  Date:  07/25/2022   Name:  Ariel Jones   DOB:  [birth year]   MRN:  [id number]  PCP:  Darreld Mclean, MD    Chief Complaint: No chief complaint on file.   History of Present Illness:  Ariel Jones is a 32 y.o. very pleasant female patient who presents with the following:  Patient seen today for follow-up, medication check She is taking Vyvanse for ADD, I recently refilled this for 3 months She also sent me a message regarding her depression, wondered about increasing her dose of Zoloft.  We will have her try increasing Zoloft from 100-150 and see if this may be helpful She has recently started seeing a new counselor Patient Active Problem List   Diagnosis Date Noted   AR (allergic rhinitis) 08/30/2015   Chest pain 05/13/2014   ADD (attention deficit disorder)    Seasonal allergies    Acne     Past Medical History:  Diagnosis Date   Acne    ADD (attention deficit disorder)    Seasonal allergies     Past Surgical History:  Procedure Laterality Date   ADENOIDECTOMY     MOUTH SURGERY     TYMPANOSTOMY TUBE PLACEMENT      Social History   Tobacco Use   Smoking status: Never   Smokeless tobacco: Never  Vaping Use   Vaping Use: Never used  Substance Use Topics   Alcohol use: Yes    Comment: 2 DRINKS PER DAY   Drug use: No    Family History  Problem Relation Age of Onset   Hypertension Father    Hypertension Mother    Cancer Maternal Grandmother        Melanoma   Thyroid disease Maternal Grandmother    Cancer Maternal Grandfather        Bladder cancer   CAD Paternal 24    Cancer Paternal 22        Melanoma   CAD Paternal 17        Died age [place] 98    No Known Allergies  Medication list has been reviewed and updated.  Current Outpatient Medications on File Prior to Visit  Medication Sig  Dispense Refill   Azelastine HCl 0.15 % SOLN SMARTSIG:2 Spray(s) Both Nares Twice Daily PRN     drospirenone-ethinyl estradiol (YAZ) 3-0.02 MG tablet Take 1 tablet by mouth daily. 84 tablet 3   EPINEPHrine 0.3 mg/0.3 mL IJ SOAJ injection See admin instructions.     fexofenadine (ALLEGRA) 60 MG tablet Take 60 mg by mouth 2 (two) times daily.     lisdexamfetamine (VYVANSE) 40 MG capsule Take 1 capsule (40 mg total) by mouth every morning. To fill in 30 days 30 capsule 0   lisdexamfetamine (VYVANSE) 40 MG capsule Take 1 capsule (40 mg total) by mouth every morning. 30 capsule 0   lisdexamfetamine (VYVANSE) 40 MG capsule Take 1 capsule (40 mg total) by mouth every morning. 30 capsule 0   Loratadine 10 MG CAPS Take by mouth.     montelukast (SINGULAIR) 10 MG tablet Take 10 mg by mouth at bedtime.     Multiple Vitamin (MULTIVITAMIN) tablet Take 1 tablet by mouth daily.     predniSONE (DELTASONE) 20 MG tablet Take 2 tablets (40 mg total) by mouth daily. 10 tablet 0   sertraline (ZOLOFT) 100 MG tablet  Take 1 tablet (100 mg total) by mouth daily. 90 tablet 3   Triamcinolone Acetonide (NASACORT AQ NA) Place into the nose daily.      UNABLE TO FIND Place under the tongue daily. Med Name: Immunotherapy Allergy Drop     No current facility-administered medications on file prior to visit.    Review of Systems:  As per HPI- otherwise negative.   Physical Examination: There were no vitals filed for this visit. There were no vitals filed for this visit. There is no height or weight on file to calculate BMI. Ideal Body Weight:    GEN: no acute distress. HEENT: Atraumatic, Normocephalic.  Ears and Nose: No external deformity. CV: RRR, No M/G/R. No JVD. No thrill. No extra heart sounds. PULM: CTA B, no wheezes, crackles, rhonchi. No retractions. No resp. distress. No accessory muscle use. ABD: S, NT, ND, +BS. No rebound. No HSM. EXTR: No c/c/e PSYCH: Normally interactive. Conversant.     Assessment and Plan: ***  Signed Lamar Blinks, MD

## 2022-07-23 NOTE — Patient Instructions (Incomplete)
It was great to see you again today!  Recommend COVID booster if not done already

## 2022-07-25 ENCOUNTER — Ambulatory Visit: Payer: Managed Care, Other (non HMO) | Admitting: Family Medicine

## 2022-08-20 MED ORDER — LISDEXAMFETAMINE DIMESYLATE 40 MG PO CAPS: 40.0000 mg | ORAL_CAPSULE | ORAL | 0 refills | Status: DC

## 2022-08-20 NOTE — Addendum Note (Signed)
Addended by: Lamar Blinks C on: 08/20/2022 07:16 AM   Modules accepted: Orders

## 2022-09-02 MED ORDER — SERTRALINE HCL 100 MG PO TABS: 150.0000 mg | ORAL_TABLET | Freq: Every day | ORAL | 3 refills | Status: DC

## 2022-09-02 NOTE — Addendum Note (Signed)
Addended by: Lamar Blinks C on: 09/02/2022 12:05 PM   Modules accepted: Orders

## 2022-09-14 NOTE — Progress Notes (Unsigned)
[place] [facility] [address], [place], [place] [address] [phone]  Date:  09/19/2022   Name:  Ariel Jones   DOB:  11-28-1990   MRN:  [id number]  PCP:  Darreld Mclean, MD    Chief Complaint: No chief complaint on file.   History of Present Illness:  Ariel Jones is a 32 y.o. very pleasant female patient who presents with the following:  Patient seen today to follow-up on her Vyvanse prescription Most recent visit with myself was in September-at that time her depression symptoms were controlled on sertraline 100 mg.  We increased her Vyvanse from 30-40 due to difficulty completing tasks at work  She has been on Vyvanse 40 since October, reviewed controlled substance database  Patient Active Problem List   Diagnosis Date Noted   AR (allergic rhinitis) 08/30/2015   Chest pain 05/13/2014   ADD (attention deficit disorder)    Seasonal allergies    Acne     Past Medical History:  Diagnosis Date   Acne    ADD (attention deficit disorder)    Seasonal allergies     Past Surgical History:  Procedure Laterality Date   ADENOIDECTOMY     MOUTH SURGERY     TYMPANOSTOMY TUBE PLACEMENT      Social History   Tobacco Use   Smoking status: Never   Smokeless tobacco: Never  Vaping Use   Vaping Use: Never used  Substance Use Topics   Alcohol use: Yes    Comment: 2 DRINKS PER DAY   Drug use: No    Family History  Problem Relation Age of Onset   Hypertension Father    Hypertension Mother    Cancer Maternal Grandmother        Melanoma   Thyroid disease Maternal Grandmother    Cancer Maternal Grandfather        Bladder cancer   CAD Paternal 37    Cancer Paternal 27        Melanoma   CAD Paternal 65        Died age MI 46    No Known Allergies  Medication list has been reviewed and updated.  Current Outpatient Medications on File Prior to Visit  Medication Sig Dispense  Refill   Azelastine HCl 0.15 % SOLN SMARTSIG:2 Spray(s) Both Nares Twice Daily PRN     drospirenone-ethinyl estradiol (YAZ) 3-0.02 MG tablet Take 1 tablet by mouth daily. 84 tablet 3   EPINEPHrine 0.3 mg/0.3 mL IJ SOAJ injection See admin instructions.     fexofenadine (ALLEGRA) 60 MG tablet Take 60 mg by mouth 2 (two) times daily.     lisdexamfetamine (VYVANSE) 40 MG capsule Take 1 capsule (40 mg total) by mouth every morning. To fill in 30 days 30 capsule 0   lisdexamfetamine (VYVANSE) 40 MG capsule Take 1 capsule (40 mg total) by mouth every morning. 30 capsule 0   lisdexamfetamine (VYVANSE) 40 MG capsule Take 1 capsule (40 mg total) by mouth every morning. 30 capsule 0   Loratadine 10 MG CAPS Take by mouth.     montelukast (SINGULAIR) 10 MG tablet Take 10 mg by mouth at bedtime.     Multiple Vitamin (MULTIVITAMIN) tablet Take 1 tablet by mouth daily.     predniSONE (DELTASONE) 20 MG tablet Take 2 tablets (40 mg total) by mouth daily. 10 tablet 0   sertraline (ZOLOFT) 100 MG tablet Take 1.5 tablets (150 mg  total) by mouth daily. 135 tablet 3   Triamcinolone Acetonide (NASACORT AQ NA) Place into the nose daily.      UNABLE TO FIND Place under the tongue daily. Med Name: Immunotherapy Allergy Drop     No current facility-administered medications on file prior to visit.    Review of Systems:  As per HPI- otherwise negative.   Physical Examination: There were no vitals filed for this visit. There were no vitals filed for this visit. There is no height or weight on file to calculate BMI. Ideal Body Weight:    GEN: no acute distress. HEENT: Atraumatic, Normocephalic.  Ears and Nose: No external deformity. CV: RRR, No M/G/R. No JVD. No thrill. No extra heart sounds. PULM: CTA B, no wheezes, crackles, rhonchi. No retractions. No resp. distress. No accessory muscle use. ABD: S, NT, ND, +BS. No rebound. No HSM. EXTR: No c/c/e PSYCH: Normally interactive. Conversant.    Assessment and  Plan: ***  Signed Lamar Blinks, MD

## 2022-09-14 NOTE — Patient Instructions (Signed)
It was great to see you again today as always, assuming all is well please see me in about 6 months

## 2022-09-19 ENCOUNTER — Ambulatory Visit: Payer: Managed Care, Other (non HMO) | Admitting: Family Medicine

## 2022-09-19 VITALS — BP 110/58 | HR 90 | Temp 98.2°F | Resp 18 | Ht 61.0 in | Wt 137.8 lb

## 2022-09-19 DIAGNOSIS — F988 Other specified behavioral and emotional disorders with onset usually occurring in childhood and adolescence: Secondary | ICD-10-CM | POA: Diagnosis not present

## 2022-09-19 DIAGNOSIS — Z13220 Encounter for screening for lipoid disorders: Secondary | ICD-10-CM

## 2022-09-19 DIAGNOSIS — Z131 Encounter for screening for diabetes mellitus: Secondary | ICD-10-CM

## 2022-09-19 DIAGNOSIS — Z1329 Encounter for screening for other suspected endocrine disorder: Secondary | ICD-10-CM

## 2022-09-19 DIAGNOSIS — F321 Major depressive disorder, single episode, moderate: Secondary | ICD-10-CM | POA: Diagnosis not present

## 2022-09-19 DIAGNOSIS — R5383 Other fatigue: Secondary | ICD-10-CM | POA: Diagnosis not present

## 2022-09-19 MED ORDER — LISDEXAMFETAMINE DIMESYLATE 40 MG PO CAPS: 40.0000 mg | ORAL_CAPSULE | ORAL | 0 refills | Status: DC

## 2022-09-20 LAB — COMPREHENSIVE METABOLIC PANEL
ALT: 11 U/L (ref 0–35)
AST: 14 U/L (ref 0–37)
Albumin: 4.4 g/dL (ref 3.5–5.2)
Alkaline Phosphatase: 43 U/L (ref 39–117)
BUN: 9 mg/dL (ref 6–23)
CO2: 28 mEq/L (ref 19–32)
Calcium: 9.5 mg/dL (ref 8.4–10.5)
Chloride: 104 mEq/L (ref 96–112)
Creatinine, Ser: 0.71 mg/dL (ref 0.40–1.20)
GFR: 113.01 mL/min (ref >60.00)
Glucose, Bld: 93 mg/dL (ref 70–99)
Potassium: 4.1 mEq/L (ref 3.5–5.1)
Sodium: 139 mEq/L (ref 135–145)
Total Bilirubin: 0.4 mg/dL (ref 0.2–1.2)
Total Protein: 6.7 g/dL (ref 6.0–8.3)

## 2022-09-20 LAB — CBC
HCT: 38.1 % (ref 36.0–46.0)
Hemoglobin: 12.7 g/dL (ref 12.0–15.0)
MCHC: 33.5 g/dL (ref 30.0–36.0)
MCV: 90.2 fl (ref 78.0–100.0)
Platelets: 268 10*3/uL (ref 150.0–400.0)
RBC: 4.22 Mil/uL (ref 3.87–5.11)
RDW: 14.3 % (ref 11.5–15.5)
WBC: 6.8 10*3/uL (ref 4.0–10.5)

## 2022-09-20 LAB — HEMOGLOBIN A1C: Hgb A1c MFr Bld: 5.1 % (ref 4.6–6.5)

## 2022-09-20 LAB — LIPID PANEL
Cholesterol: 202 mg/dL — ABNORMAL HIGH (ref 0–200)
HDL: 82.3 mg/dL (ref >39.00)
LDL Cholesterol: 105 mg/dL — ABNORMAL HIGH (ref 0–99)
NonHDL: 119.86
Total CHOL/HDL Ratio: 2
Triglycerides: 76 mg/dL (ref 0.0–149.0)
VLDL: 15.2 mg/dL (ref 0.0–40.0)

## 2022-09-21 ENCOUNTER — Encounter: Payer: Self-pay | Admitting: Family Medicine

## 2022-09-21 LAB — TSH: TSH: 1.37 u[IU]/mL (ref 0.35–5.50)

## 2022-09-21 LAB — VITAMIN D 25 HYDROXY (VIT D DEFICIENCY, FRACTURES): VITD: 37.09 ng/mL (ref 30.00–100.00)

## 2022-10-20 ENCOUNTER — Encounter: Payer: Self-pay | Admitting: Family Medicine

## 2022-10-20 DIAGNOSIS — F988 Other specified behavioral and emotional disorders with onset usually occurring in childhood and adolescence: Secondary | ICD-10-CM

## 2022-10-21 MED ORDER — LISDEXAMFETAMINE DIMESYLATE 40 MG PO CAPS: 40.0000 mg | ORAL_CAPSULE | ORAL | 0 refills | Status: DC

## 2023-01-20 ENCOUNTER — Other Ambulatory Visit: Payer: Self-pay | Admitting: Family

## 2023-01-20 ENCOUNTER — Encounter: Payer: Self-pay | Admitting: Family Medicine

## 2023-01-20 ENCOUNTER — Telehealth: Payer: Self-pay | Admitting: Family Medicine

## 2023-01-20 DIAGNOSIS — F988 Other specified behavioral and emotional disorders with onset usually occurring in childhood and adolescence: Secondary | ICD-10-CM

## 2023-01-20 MED ORDER — LISDEXAMFETAMINE DIMESYLATE 40 MG PO CAPS: 40.0000 mg | ORAL_CAPSULE | ORAL | 0 refills | Status: DC

## 2023-01-20 NOTE — Telephone Encounter (Signed)
Patient called and would like a med refill on lisdexamfetamine (VYVANSE) 40 MG capsule

## 2023-01-20 NOTE — Telephone Encounter (Signed)
Okay for refill?  

## 2023-01-21 MED ORDER — LISDEXAMFETAMINE DIMESYLATE 40 MG PO CAPS: 40.0000 mg | ORAL_CAPSULE | ORAL | 0 refills | Status: DC

## 2023-01-21 NOTE — Addendum Note (Signed)
Addended by: Abbe Amsterdam C on: 01/21/2023 01:58 AM   Modules accepted: Orders

## 2023-02-10 ENCOUNTER — Ambulatory Visit: Payer: Managed Care, Other (non HMO) | Admitting: Family Medicine

## 2023-02-24 ENCOUNTER — Encounter: Payer: Self-pay | Admitting: Family Medicine

## 2023-02-24 DIAGNOSIS — F988 Other specified behavioral and emotional disorders with onset usually occurring in childhood and adolescence: Secondary | ICD-10-CM

## 2023-02-24 MED ORDER — LISDEXAMFETAMINE DIMESYLATE 40 MG PO CAPS: 40.0000 mg | ORAL_CAPSULE | ORAL | 0 refills | Status: DC

## 2023-04-01 ENCOUNTER — Encounter: Payer: Self-pay | Admitting: Family Medicine

## 2023-04-01 DIAGNOSIS — F988 Other specified behavioral and emotional disorders with onset usually occurring in childhood and adolescence: Secondary | ICD-10-CM

## 2023-04-01 DIAGNOSIS — R41840 Attention and concentration deficit: Secondary | ICD-10-CM

## 2023-04-01 MED ORDER — LISDEXAMFETAMINE DIMESYLATE 40 MG PO CAPS
40.0000 mg | ORAL_CAPSULE | ORAL | 0 refills | Status: DC
Start: 2023-04-01 — End: 2023-05-07

## 2023-05-01 MED ORDER — LISDEXAMFETAMINE DIMESYLATE 40 MG PO CAPS
40.0000 mg | ORAL_CAPSULE | ORAL | 0 refills | Status: DC
Start: 2023-05-01 — End: 2023-05-07

## 2023-05-01 NOTE — Addendum Note (Signed)
Addended by: Abbe Amsterdam C on: 05/01/2023 09:29 AM   Modules accepted: Orders

## 2023-05-06 NOTE — Patient Instructions (Signed)
Always a pleasure to see you, assuming all is well lets follow-up in about 6 months

## 2023-05-06 NOTE — Progress Notes (Unsigned)
[facility] [address] [place], [place] [address] [phone]  Date:  05/07/2023   Name:  Ariel Jones   DOB:  12-07-1990   MRN:  [id number]  PCP:  Pearline Cables, MD    Chief Complaint: f/u adhd (Concerns/ questions: pt says she has been on Doxy for about 1 month for acne, now on probiotic/Pap: last done 2019 with [facility]/Flu shot today: will get this at work)   History of Present Illness:  Ariel Jones is a 32 y.o. very pleasant female patient who presents with the following:  Patient seen today for follow-up on Vyvanse which she uses for ADD Otherwise generally in good health Most recent visit with myself was in March She is currently stable on Vyvanse 40 mg daily-she notes this dosage works well for her.  She is able to eat and sleep normally, but also has control of her ADD symptoms.  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  She will let me know  Pap smear- per GYN  Flu shot- at work  COVID booster recommended We did routine lab work for her in March Patient Active Problem List   Diagnosis Date Noted   AR (allergic rhinitis) 08/30/2015   Chest pain 05/13/2014   ADD (attention deficit disorder)    Seasonal allergies    Acne     Past Medical History:  Diagnosis Date   Acne    ADD (attention deficit disorder)    Seasonal allergies     Past Surgical History:  Procedure Laterality Date   ADENOIDECTOMY     MOUTH SURGERY     TYMPANOSTOMY TUBE PLACEMENT      Social History   Tobacco Use   Smoking status: Never   Smokeless tobacco: Never  Vaping Use   Vaping status: Never Used  Substance Use Topics   Alcohol use: Yes    Comment: 2 DRINKS PER DAY   Drug use: No    Family History  Problem Relation  Age of Onset   Hypertension Father    Hypertension Mother    Cancer Maternal Grandmother        Melanoma   Thyroid disease Maternal Grandmother    Cancer Maternal Grandfather        Bladder cancer   CAD Paternal Grandmother    Cancer Paternal Grandmother        Melanoma   CAD Paternal Grandfather        Died age MI 69    Allergies  Allergen Reactions   Molds & Smuts Other (See Comments)    Medication list has been reviewed and updated.  Current Outpatient Medications on File Prior to Visit  Medication Sig Dispense Refill   Azelastine HCl 0.15 % SOLN SMARTSIG:2 Spray(s) Both Nares Twice Daily PRN     fexofenadine (ALLEGRA) 60 MG tablet Take 60 mg by mouth 2 (two) times daily.     Loratadine 10 MG CAPS Take by mouth.     montelukast (SINGULAIR) 10 MG tablet Take 10 mg by mouth at bedtime.     Multiple Vitamin (MULTIVITAMIN) tablet Take 1 tablet by mouth daily.     Probiotic Product (PROBIOTIC ADVANCED PO) Take by mouth.     sertraline (  ZOLOFT) 100 MG tablet Take 1.5 tablets (150 mg total) by mouth daily. 135 tablet 3   Triamcinolone Acetonide (NASACORT AQ NA) Place into the nose daily.      No current facility-administered medications on file prior to visit.    Review of Systems:  As per HPI- otherwise negative.   Physical Examination: Vitals:   05/07/23 1317  BP: 112/72  Pulse: 93  Resp: 18  Temp: 97.8 F (36.6 C)  SpO2: 99%   Vitals:   05/07/23 1317  Weight: 144 lb 9.6 oz (65.6 kg)  Height: 5\' 1"  (1.549 m)   Body mass index is 27.32 kg/m. Ideal Body Weight: Weight in (lb) to have BMI = 25: 132  GEN: no acute distress.  Mild overweight, looks well HEENT: Atraumatic, Normocephalic.  Ears and Nose: No external deformity. CV: RRR, No M/G/R. No JVD. No thrill. No extra heart sounds. PULM: CTA B, no wheezes, crackles, rhonchi. No retractions. No resp. distress. No accessory muscle use. EXTR: No c/c/e PSYCH: Normally interactive. Conversant.    Assessment  and Plan: Attention or concentration deficit - Plan: lisdexamfetamine (VYVANSE) 40 MG capsule, lisdexamfetamine (VYVANSE) 40 MG capsule, lisdexamfetamine (VYVANSE) 40 MG capsule  Following Attention deficit today.  Refilled Vyvanse 40 mg daily for 3 more months.  She will see me again in person in about 6 months  Signed Abbe Amsterdam, MD

## 2023-05-07 ENCOUNTER — Ambulatory Visit (INDEPENDENT_AMBULATORY_CARE_PROVIDER_SITE_OTHER): Payer: Managed Care, Other (non HMO) | Admitting: Family Medicine

## 2023-05-07 DIAGNOSIS — R41840 Attention and concentration deficit: Secondary | ICD-10-CM

## 2023-05-07 MED ORDER — LISDEXAMFETAMINE DIMESYLATE 40 MG PO CAPS: 40.0000 mg | ORAL_CAPSULE | ORAL | 0 refills | Status: DC

## 2023-09-01 ENCOUNTER — Encounter: Payer: Self-pay | Admitting: Family Medicine

## 2023-09-02 MED ORDER — LISDEXAMFETAMINE DIMESYLATE 50 MG PO CAPS: 50.0000 mg | ORAL_CAPSULE | Freq: Every day | ORAL | 0 refills | Status: DC

## 2023-09-02 NOTE — Addendum Note (Signed)
 Addended by: Abbe Amsterdam C on: 09/02/2023 11:39 AM   Modules accepted: Orders

## 2023-09-04 ENCOUNTER — Other Ambulatory Visit: Payer: Self-pay | Admitting: Family Medicine

## 2023-09-04 DIAGNOSIS — F321 Major depressive disorder, single episode, moderate: Secondary | ICD-10-CM

## 2023-10-02 ENCOUNTER — Other Ambulatory Visit: Payer: Self-pay | Admitting: Family Medicine

## 2023-10-02 ENCOUNTER — Encounter: Payer: Self-pay | Admitting: Family Medicine

## 2023-10-02 DIAGNOSIS — R41840 Attention and concentration deficit: Secondary | ICD-10-CM

## 2023-10-02 MED ORDER — LISDEXAMFETAMINE DIMESYLATE 50 MG PO CAPS
50.0000 mg | ORAL_CAPSULE | Freq: Every day | ORAL | 0 refills | Status: AC
Start: 2023-10-02 — End: ?

## 2023-10-02 MED ORDER — LISDEXAMFETAMINE DIMESYLATE 50 MG PO CAPS: 50.0000 mg | ORAL_CAPSULE | Freq: Every day | ORAL | 0 refills | Status: AC

## 2023-10-02 MED ORDER — LISDEXAMFETAMINE DIMESYLATE 50 MG PO CAPS: 50.0000 mg | ORAL_CAPSULE | Freq: Every day | ORAL | 0 refills | Status: DC

## 2023-12-05 ENCOUNTER — Encounter: Payer: Self-pay | Admitting: Family Medicine

## 2023-12-05 ENCOUNTER — Other Ambulatory Visit: Payer: Self-pay | Admitting: Family Medicine

## 2023-12-05 DIAGNOSIS — F321 Major depressive disorder, single episode, moderate: Secondary | ICD-10-CM

## 2023-12-05 DIAGNOSIS — R41840 Attention and concentration deficit: Secondary | ICD-10-CM

## 2023-12-05 MED ORDER — LISDEXAMFETAMINE DIMESYLATE 40 MG PO CAPS: 40.0000 mg | ORAL_CAPSULE | ORAL | 0 refills | Status: DC

## 2023-12-05 NOTE — Progress Notes (Unsigned)
 [name] [address], [address] [place], [place] [address] [phone]  Date:  12/11/2023   Name:  Ariel Jones   DOB:  21-Nov-1990   MRN:  [id number]  PCP:  Kaylee Partridge, MD    Chief Complaint: No chief complaint on file.   History of Present Illness:  SONI Jones is a 33 y.o. very pleasant female patient who presents with the following:  Patient seen today for medication follow-up Most recent visit with myself was in October She is maintained on Vyvanse  40 mg daily for ADD symptoms Sertraline  for depression, allergy medication She works in intensive care nursing Reviewed PDMP, she most recently filled Vyvanse  on April 22  Pap smear Can update labs today, most recently done March 2024 Patient Active Problem List   Diagnosis Date Noted   AR (allergic rhinitis) 08/30/2015   Chest pain 05/13/2014   ADD (attention deficit disorder)    Seasonal allergies    Acne     Past Medical History:  Diagnosis Date   Acne    ADD (attention deficit disorder)    Seasonal allergies     Past Surgical History:  Procedure Laterality Date   ADENOIDECTOMY     MOUTH SURGERY     TYMPANOSTOMY TUBE PLACEMENT      Social History   Tobacco Use   Smoking status: Never   Smokeless tobacco: Never  Vaping Use   Vaping status: Never Used  Substance Use Topics   Alcohol use: Yes    Comment: 2 DRINKS PER DAY   Drug use: No    Family History  Problem Relation Age of Onset   Hypertension Father    Hypertension Mother    Cancer Maternal Grandmother        Melanoma   Thyroid  disease Maternal Grandmother    Cancer Maternal Grandfather        Bladder cancer   CAD Paternal Grandmother    Cancer Paternal Grandmother        Melanoma   CAD Paternal Grandfather        Died age MI 70    Allergies  Allergen Reactions   Molds & Smuts Other (See Comments)    Medication list has been reviewed and updated.  Current  Outpatient Medications on File Prior to Visit  Medication Sig Dispense Refill   Azelastine HCl 0.15 % SOLN SMARTSIG:2 Spray(s) Both Nares Twice Daily PRN     fexofenadine (ALLEGRA) 60 MG tablet Take 60 mg by mouth 2 (two) times daily.     lisdexamfetamine (VYVANSE ) 40 MG capsule Take 1 capsule (40 mg total) by mouth every morning. 30 capsule 0   lisdexamfetamine (VYVANSE ) 40 MG capsule Take 1 capsule (40 mg total) by mouth every morning. 30 capsule 0   lisdexamfetamine (VYVANSE ) 40 MG capsule Take 1 capsule (40 mg total) by mouth every morning. 30 capsule 0   lisdexamfetamine (VYVANSE ) 50 MG capsule Take 1 capsule (50 mg total) by mouth daily. To fill in 30 days 30 capsule 0   lisdexamfetamine (VYVANSE ) 50 MG capsule Take 1 capsule (50 mg total) by mouth daily. 30 capsule 0   Loratadine 10 MG CAPS Take by mouth.     montelukast  (SINGULAIR ) 10 MG tablet Take 10 mg by mouth at bedtime.     Multiple Vitamin (MULTIVITAMIN) tablet Take 1 tablet by mouth daily.     Probiotic Product (PROBIOTIC ADVANCED PO) Take by mouth.  sertraline  (ZOLOFT ) 100 MG tablet Take 1.5 tablets (150 mg total) by mouth daily. 135 tablet 0   Triamcinolone Acetonide (NASACORT AQ NA) Place into the nose daily.      No current facility-administered medications on file prior to visit.    Review of Systems:  As per HPI- otherwise negative.   Physical Examination: There were no vitals filed for this visit. There were no vitals filed for this visit. There is no height or weight on file to calculate BMI. Ideal Body Weight:    GEN: no acute distress. HEENT: Atraumatic, Normocephalic.  Ears and Nose: No external deformity. CV: RRR, No M/G/R. No JVD. No thrill. No extra heart sounds. PULM: CTA B, no wheezes, crackles, rhonchi. No retractions. No resp. distress. No accessory muscle use. ABD: S, NT, ND, +BS. No rebound. No HSM. EXTR: No c/c/e PSYCH: Normally interactive. Conversant.    Assessment and  Plan: ***  Signed Gates Kasal, MD

## 2023-12-05 NOTE — Patient Instructions (Incomplete)
 It was good to see you today!  Assuming all is well please see me in about 6 months I will be in touch with your labs Let's have you try spironolactone 12.5 mg daily- can increase to 25 if you are not seeing results  Let's recheck our K level in about one month; can be done as a lab visit only  I will let you know what we see on your x-rays Try the flexeril as needed for pain- let me know if not getting better in the next week or so

## 2023-12-11 ENCOUNTER — Ambulatory Visit (HOSPITAL_BASED_OUTPATIENT_CLINIC_OR_DEPARTMENT_OTHER)
Admission: RE | Admit: 2023-12-11 | Discharge: 2023-12-11 | Disposition: A | Discharge status: Home / Self Care | Source: Ambulatory Visit | Attending: Family Medicine | Admitting: Family Medicine

## 2023-12-11 ENCOUNTER — Encounter: Payer: Self-pay | Admitting: Family Medicine

## 2023-12-11 ENCOUNTER — Ambulatory Visit: Admitting: Family Medicine

## 2023-12-11 VITALS — BP 120/80 | HR 99 | Temp 98.3°F | Ht 61.0 in | Wt 146.0 lb

## 2023-12-11 DIAGNOSIS — M545 Low back pain, unspecified: Secondary | ICD-10-CM | POA: Insufficient documentation

## 2023-12-11 DIAGNOSIS — Z1329 Encounter for screening for other suspected endocrine disorder: Secondary | ICD-10-CM | POA: Diagnosis not present

## 2023-12-11 DIAGNOSIS — Z23 Encounter for immunization: Secondary | ICD-10-CM | POA: Diagnosis not present

## 2023-12-11 DIAGNOSIS — Z130 Encounter for screening for diseases of the blood and blood-forming organs and certain disorders involving the immune mechanism: Secondary | ICD-10-CM | POA: Diagnosis not present

## 2023-12-11 DIAGNOSIS — W108XXA Fall (on) (from) other stairs and steps, initial encounter: Secondary | ICD-10-CM | POA: Diagnosis not present

## 2023-12-11 DIAGNOSIS — R41840 Attention and concentration deficit: Secondary | ICD-10-CM

## 2023-12-11 DIAGNOSIS — Z13220 Encounter for screening for lipoid disorders: Secondary | ICD-10-CM

## 2023-12-11 DIAGNOSIS — Z131 Encounter for screening for diabetes mellitus: Secondary | ICD-10-CM

## 2023-12-11 DIAGNOSIS — L700 Acne vulgaris: Secondary | ICD-10-CM

## 2023-12-11 DIAGNOSIS — Z5181 Encounter for therapeutic drug level monitoring: Secondary | ICD-10-CM

## 2023-12-11 LAB — COMPREHENSIVE METABOLIC PANEL WITH GFR
ALT: 11 U/L (ref 0–35)
AST: 15 U/L (ref 0–37)
Albumin: 4.8 g/dL (ref 3.5–5.2)
Alkaline Phosphatase: 38 U/L — ABNORMAL LOW (ref 39–117)
BUN: 9 mg/dL (ref 6–23)
CO2: 28 meq/L (ref 19–32)
Calcium: 9.6 mg/dL (ref 8.4–10.5)
Chloride: 104 meq/L (ref 96–112)
Creatinine, Ser: 0.72 mg/dL (ref 0.40–1.20)
GFR: 110.18 mL/min (ref >60.00)
Glucose, Bld: 99 mg/dL (ref 70–99)
Potassium: 4.7 meq/L (ref 3.5–5.1)
Sodium: 138 meq/L (ref 135–145)
Total Bilirubin: 0.6 mg/dL (ref 0.2–1.2)
Total Protein: 6.9 g/dL (ref 6.0–8.3)

## 2023-12-11 LAB — CBC
HCT: 40.4 % (ref 36.0–46.0)
Hemoglobin: 13.4 g/dL (ref 12.0–15.0)
MCHC: 33.2 g/dL (ref 30.0–36.0)
MCV: 93.9 fl (ref 78.0–100.0)
Platelets: 271 10*3/uL (ref 150.0–400.0)
RBC: 4.31 Mil/uL (ref 3.87–5.11)
RDW: 13.2 % (ref 11.5–15.5)
WBC: 5.2 10*3/uL (ref 4.0–10.5)

## 2023-12-11 LAB — TSH: TSH: 1.94 u[IU]/mL (ref 0.35–5.50)

## 2023-12-11 LAB — LIPID PANEL
Cholesterol: 203 mg/dL — ABNORMAL HIGH (ref 0–200)
HDL: 79.5 mg/dL (ref >39.00)
LDL Cholesterol: 112 mg/dL — ABNORMAL HIGH (ref 0–99)
NonHDL: 123.95
Total CHOL/HDL Ratio: 3
Triglycerides: 61 mg/dL (ref 0.0–149.0)
VLDL: 12.2 mg/dL (ref 0.0–40.0)

## 2023-12-11 LAB — HEMOGLOBIN A1C: Hgb A1c MFr Bld: 5 % (ref 4.6–6.5)

## 2023-12-11 MED ORDER — LISDEXAMFETAMINE DIMESYLATE 40 MG PO CAPS: 40.0000 mg | ORAL_CAPSULE | ORAL | 0 refills | Status: DC

## 2023-12-11 MED ORDER — CYCLOBENZAPRINE HCL 10 MG PO TABS: 10.0000 mg | ORAL_TABLET | Freq: Two times a day (BID) | ORAL | 0 refills | Status: AC | PRN

## 2023-12-11 MED ORDER — SPIRONOLACTONE 25 MG PO TABS: 12.5000 mg | ORAL_TABLET | Freq: Every day | ORAL | 3 refills | Status: DC

## 2024-03-01 ENCOUNTER — Other Ambulatory Visit: Payer: Self-pay | Admitting: Family Medicine

## 2024-03-01 DIAGNOSIS — F321 Major depressive disorder, single episode, moderate: Secondary | ICD-10-CM

## 2024-04-09 ENCOUNTER — Encounter: Payer: Self-pay | Admitting: Family Medicine

## 2024-04-09 DIAGNOSIS — R41840 Attention and concentration deficit: Secondary | ICD-10-CM

## 2024-04-09 MED ORDER — LISDEXAMFETAMINE DIMESYLATE 40 MG PO CAPS: 40.0000 mg | ORAL_CAPSULE | ORAL | 0 refills | Status: DC

## 2024-04-09 MED ORDER — LISDEXAMFETAMINE DIMESYLATE 40 MG PO CAPS: 40.0000 mg | ORAL_CAPSULE | ORAL | 0 refills | Status: AC

## 2024-06-24 ENCOUNTER — Other Ambulatory Visit: Payer: Self-pay | Admitting: Family Medicine

## 2024-06-24 DIAGNOSIS — F321 Major depressive disorder, single episode, moderate: Secondary | ICD-10-CM

## 2024-07-14 ENCOUNTER — Encounter: Payer: Self-pay | Admitting: Family Medicine

## 2024-07-14 DIAGNOSIS — R41840 Attention and concentration deficit: Secondary | ICD-10-CM

## 2024-07-14 MED ORDER — LISDEXAMFETAMINE DIMESYLATE 40 MG PO CAPS: 40.0000 mg | ORAL_CAPSULE | ORAL | 0 refills | Status: AC

## 2024-07-16 NOTE — Progress Notes (Signed)
 [facility] [address], [address] [place], [place] [address] [phone]  Date:  07/19/2024   Name:  Ariel Jones   DOB:  1991/03/16   MRN:  [id number]  PCP:  Watt Harlene BROCKS, MD    Chief Complaint: Annual Exam   History of Present Illness:  Ariel Jones is a 34 y.o. very pleasant female patient who presents with the following:  Pt seen today for physical exam and vyvanse  refill Last seen by me in May  She is maintained on Vyvanse  40 mg daily for ADD symptoms- she is happy with this dosage.  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  History of Present Illness Ariel Jones is a 34 year old female who presents for medication management and follow-up for mood concerns.  Her mood is currently stable, but she believes an increase in her Vyvanse  dosage might be beneficial. She is currently taking 40 mg of Vyvanse  and has previously been on 50 mg. She recently filled her prescription for sertraline , which she finds effective at 150 mg. Despite taking vitamin D , she experiences a dip in mood during the winter months.  She mentions a change in her insurance, which now allows her to use pharmacies other than [facility], and she is considering using [facility].  Regarding her skin condition, she has been prescribed spironolactone , which she has not been taking recently. It has helped her skin and scalp in the past. She started with half a pill of spironolactone  and later increased to a full 25 mg pill, which she found effective. She would like to get  started back on 25 mg if possible   She recalls having blood work done in May, which showed normal A1c, thyroid  function, blood count, and metabolic panel, with reasonable cholesterol levels. She has not had a recent lab draw for spironolactone  monitoring.  She exercises by walking and doing strength training, although not as frequently as before. She received her flu shot and has completed the Gardasil series.  She declines STI screening or contraception at this time     Patient Active Problem List   Diagnosis Date Noted   AR (allergic rhinitis) 08/30/2015   Chest pain 05/13/2014   ADD (attention deficit disorder)    Seasonal allergies    Acne     Past Medical History:  Diagnosis Date   Acne    ADD (attention deficit disorder)    Seasonal allergies     Past Surgical History:  Procedure Laterality Date   ADENOIDECTOMY     MOUTH SURGERY     TYMPANOSTOMY TUBE PLACEMENT      Social History[1]  Family History  Problem Relation Age of Onset   Hypertension Father    Hypertension Mother    Cancer Maternal Grandmother        Melanoma   Thyroid  disease Maternal Grandmother    Cancer Maternal Grandfather        Bladder cancer   CAD Paternal Grandmother    Cancer Paternal Grandmother  Melanoma   CAD Paternal Grandfather        Died age MI 17    Allergies[2]  Medication list has been reviewed and updated.  Medications Ordered Prior to Encounter[3]  Review of Systems:  As per HPI- otherwise negative.   Physical Examination: Vitals:   07/19/24 1331 07/19/24 1352  BP: (!) 140/82 138/84  Pulse: 100   Temp: 99.2 F (37.3 C)   SpO2: 99%    Vitals:   07/19/24 1331  Weight: 151 lb 3.2 oz (68.6 kg)  Height: 5' 1 (1.549 m)   Body mass index is 28.57 kg/m. Ideal Body Weight: Weight in (lb) to have BMI = 25: 132  GEN: no acute distress.  Minimal overweight, looks well  HEENT: Atraumatic, Normocephalic.  Bilateral TM wnl, oropharynx normal.  PEERL,EOMI.    Ears and Nose: No external deformity. CV: RRR, No M/G/R. No JVD. No thrill. No extra heart sounds. PULM: CTA B, no wheezes, crackles, rhonchi. No retractions. No resp. distress. No accessory muscle use. ABD: S, NT, ND, +BS. No rebound. No HSM. EXTR: No c/c/e PSYCH: Normally interactive. Conversant.  Normal vulva, vagina, cervix.  Pap collected   Assessment and Plan: Physical exam  Current moderate episode of major depressive disorder without prior episode (HCC) - Plan: sertraline  (ZOLOFT ) 100 MG tablet  Acne vulgaris - Plan: spironolactone  (ALDACTONE ) 25 MG tablet, Basic metabolic panel with GFR  Screening for cervical cancer - Plan: Cytology - PAP  Assessment & Plan [facility] Visit Routine visit with slightly elevated blood pressure. No family history of breast or colon cancer. Pap smear due. Vaccinations up to date. - Performed Pap smear and HPV testing. - Ordered basic metabolic panel in one month. - Rechecked blood pressure.  Major depressive disorder, single episode, moderate Mood well-managed with sertraline . Occasional mood dips during winter. - Refilled sertraline  150 mg with a 90-day supply.  Attention-deficit hyperactivity disorder, inattentive type Currently on lisdexamfetamine  40 mg. Considering increase to 50 mg at her next fill- she has used 50 in the past . - Message provider in one month to discuss potential increase to 50 mg of Vyvanse .  Acne vulgaris Previously prescribed spironolactone  25 mg, not taken. Improvement noted with previous use. Wishes to continue treatment. - Refilled spironolactone  25 mg. - Scheduled lab draw for spironolactone  monitoring.  Signed Harlene Schroeder, MD     [1]  Social History Tobacco Use   Smoking status: Never   Smokeless tobacco: Never  Vaping Use   Vaping status: Never Used  Substance Use Topics   Alcohol use: Yes    Comment: 2 DRINKS PER DAY   Drug use: No  [2]  Allergies Allergen Reactions   Molds &  Smuts Other (See Comments)  [3]  Current Outpatient Medications on File Prior to Visit  Medication Sig Dispense Refill   Azelastine HCl 0.15 % SOLN SMARTSIG:2 Spray(s) Both Nares Twice Daily PRN     cholecalciferol (VITAMIN D3) 25 MCG (1000 UNIT) tablet Take 1,000 Units by mouth daily.     fexofenadine (ALLEGRA) 60 MG tablet Take 60 mg by mouth 2 (two) times daily.     lisdexamfetamine  (VYVANSE ) 40 MG capsule Take 1 capsule (40 mg total) by mouth every morning. 30 capsule 0   lisdexamfetamine  (VYVANSE ) 40 MG capsule Take 1 capsule (40 mg total) by mouth every morning. 30 capsule 0   lisdexamfetamine  (VYVANSE ) 40 MG capsule Take 1 capsule (40 mg total) by mouth every morning. 30 capsule 0   lisdexamfetamine  (  VYVANSE ) 50 MG capsule Take 1 capsule (50 mg total) by mouth daily. To fill in 30 days 30 capsule 0   lisdexamfetamine  (VYVANSE ) 50 MG capsule Take 1 capsule (50 mg total) by mouth daily. 30 capsule 0   Loratadine 10 MG CAPS Take by mouth.     montelukast  (SINGULAIR ) 10 MG tablet Take 10 mg by mouth at bedtime.     Multiple Vitamin (MULTIVITAMIN) tablet Take 1 tablet by mouth daily.     Probiotic Product (PROBIOTIC ADVANCED PO) Take by mouth.     Triamcinolone Acetonide (NASACORT AQ NA) Place into the nose daily.      cyclobenzaprine  (FLEXERIL ) 10 MG tablet Take 1 tablet (10 mg total) by mouth 2 (two) times daily as needed for muscle spasms. (Patient not taking: Reported on 07/19/2024) 30 tablet 0   No current facility-administered medications on file prior to visit.   "

## 2024-07-19 ENCOUNTER — Ambulatory Visit: Admitting: Family Medicine

## 2024-07-19 ENCOUNTER — Other Ambulatory Visit (HOSPITAL_COMMUNITY)
Admission: RE | Admit: 2024-07-19 | Discharge: 2024-07-19 | Disposition: A | Discharge status: Home / Self Care | Source: Ambulatory Visit | Attending: Family Medicine | Admitting: Family Medicine

## 2024-07-19 ENCOUNTER — Encounter: Payer: Self-pay | Admitting: Family Medicine

## 2024-07-19 VITALS — BP 138/84 | HR 100 | Temp 99.2°F | Ht 61.0 in | Wt 151.2 lb

## 2024-07-19 DIAGNOSIS — Z124 Encounter for screening for malignant neoplasm of cervix: Secondary | ICD-10-CM | POA: Insufficient documentation

## 2024-07-19 DIAGNOSIS — L700 Acne vulgaris: Secondary | ICD-10-CM | POA: Diagnosis not present

## 2024-07-19 DIAGNOSIS — F321 Major depressive disorder, single episode, moderate: Secondary | ICD-10-CM | POA: Diagnosis not present

## 2024-07-19 DIAGNOSIS — Z0000 Encounter for general adult medical examination without abnormal findings: Secondary | ICD-10-CM | POA: Diagnosis not present

## 2024-07-19 MED ORDER — SERTRALINE HCL 100 MG PO TABS: 150.0000 mg | ORAL_TABLET | Freq: Every day | ORAL | 3 refills | Status: AC

## 2024-07-19 MED ORDER — SPIRONOLACTONE 25 MG PO TABS: 25.0000 mg | ORAL_TABLET | Freq: Every day | ORAL | 3 refills | Status: DC

## 2024-07-19 NOTE — Patient Instructions (Signed)
 Good to see you today!  I will be in touch with your pap Please let me know when you need more vyvanse  and remind me that you want to try the 50 mg Please schedule a lab visit only in a month or so to check your K while you are on the spiro

## 2024-07-20 ENCOUNTER — Encounter: Payer: Self-pay | Admitting: Family Medicine

## 2024-07-20 ENCOUNTER — Telehealth: Payer: Self-pay

## 2024-07-20 DIAGNOSIS — L700 Acne vulgaris: Secondary | ICD-10-CM

## 2024-07-20 LAB — CYTOLOGY - PAP
Comment: NEGATIVE
Diagnosis: NEGATIVE
High risk HPV: NEGATIVE

## 2024-07-20 MED ORDER — SPIRONOLACTONE 25 MG PO TABS: 25.0000 mg | ORAL_TABLET | Freq: Every day | ORAL | 3 refills | Status: DC

## 2024-07-20 NOTE — Telephone Encounter (Signed)
 Copied from CRM [id number]. Topic: Clinical - Medication Question >> Jul 20, 2024  3:16 PM Berneda FALCON wrote: Reason for CRM: Brooke from [facility] pharmacy is needing clarification on this medication: spironolactone  (ALDACTONE ) 25 MG tablet   Directions state: Take 1 tablet (25 mg total) by mouth daily. Can increase to 25 mg if needed  Does this mean to say, can increase to 50MG  if needed? The tablets are already 25 MG  [facility] [place], [place] - [address] [place] [address] Phone: [phone] Fax: [phone] Hours: Not open 24 hours

## 2024-07-21 MED ORDER — SPIRONOLACTONE 25 MG PO TABS: 25.0000 mg | ORAL_TABLET | Freq: Every day | ORAL | 3 refills | Status: AC

## 2024-08-10 ENCOUNTER — Encounter: Payer: Self-pay | Admitting: Family Medicine

## 2024-08-10 DIAGNOSIS — R41840 Attention and concentration deficit: Secondary | ICD-10-CM

## 2024-08-11 MED ORDER — LISDEXAMFETAMINE DIMESYLATE 50 MG PO CAPS: 50.0000 mg | ORAL_CAPSULE | Freq: Every day | ORAL | 0 refills | Status: AC

## 2024-08-19 ENCOUNTER — Encounter: Payer: Self-pay | Admitting: Family Medicine

## 2024-08-19 ENCOUNTER — Other Ambulatory Visit

## 2024-08-19 DIAGNOSIS — L700 Acne vulgaris: Secondary | ICD-10-CM

## 2024-08-19 LAB — BASIC METABOLIC PANEL WITH GFR
BUN: 11 mg/dL (ref 6–23)
CO2: 30 meq/L (ref 19–32)
Calcium: 9.6 mg/dL (ref 8.4–10.5)
Chloride: 105 meq/L (ref 96–112)
Creatinine, Ser: 0.76 mg/dL (ref 0.40–1.20)
GFR: 102.76 mL/min
Glucose, Bld: 101 mg/dL — ABNORMAL HIGH (ref 70–99)
Potassium: 5.2 meq/L — ABNORMAL HIGH (ref 3.5–5.1)
Sodium: 140 meq/L (ref 135–145)
